# Patient Record
Sex: Male | Born: 1935 | Race: White | Hispanic: No | Marital: Married | Smoking: Former smoker
Health system: Southern US, Community
[De-identification: ages and names within clinical notes are randomized; demographics above are authoritative.]

## PROBLEM LIST (undated history)

## (undated) DIAGNOSIS — C801 Malignant (primary) neoplasm, unspecified: Secondary | ICD-10-CM

## (undated) DIAGNOSIS — K922 Gastrointestinal hemorrhage, unspecified: Secondary | ICD-10-CM

## (undated) DIAGNOSIS — K5792 Diverticulitis of intestine, part unspecified, without perforation or abscess without bleeding: Secondary | ICD-10-CM

## (undated) DIAGNOSIS — M797 Fibromyalgia: Secondary | ICD-10-CM

## (undated) DIAGNOSIS — Z9289 Personal history of other medical treatment: Secondary | ICD-10-CM

## (undated) DIAGNOSIS — I499 Cardiac arrhythmia, unspecified: Secondary | ICD-10-CM

## (undated) HISTORY — PX: MOHS SURGERY: SUR867

## (undated) HISTORY — PX: PAROTIDECTOMY: SUR1003

## (undated) HISTORY — PX: TONSILLECTOMY: SUR1361

## (undated) HISTORY — PX: COLONOSCOPY: SHX174

## (undated) HISTORY — PX: PROSTATE SURGERY: SHX751

---

## 2012-11-03 HISTORY — PX: CARDIAC CATHETERIZATION: SHX172

## 2015-03-21 ENCOUNTER — Other Ambulatory Visit: Payer: Self-pay | Admitting: Orthopedic Surgery

## 2015-04-12 ENCOUNTER — Other Ambulatory Visit (HOSPITAL_COMMUNITY): Payer: Self-pay | Admitting: *Deleted

## 2015-04-12 ENCOUNTER — Ambulatory Visit (HOSPITAL_COMMUNITY)
Admission: RE | Admit: 2015-04-12 | Discharge: 2015-04-12 | Disposition: A | Discharge status: Home / Self Care | Payer: Medicare Other | Source: Ambulatory Visit | Attending: Orthopedic Surgery | Admitting: Orthopedic Surgery

## 2015-04-12 ENCOUNTER — Encounter (HOSPITAL_COMMUNITY)
Admission: RE | Admit: 2015-04-12 | Discharge: 2015-04-12 | Disposition: A | Discharge status: Home / Self Care | Payer: Medicare Other | Source: Ambulatory Visit | Attending: Orthopedic Surgery | Admitting: Orthopedic Surgery

## 2015-04-12 ENCOUNTER — Encounter (HOSPITAL_COMMUNITY): Payer: Self-pay

## 2015-04-12 DIAGNOSIS — Z01818 Encounter for other preprocedural examination: Secondary | ICD-10-CM

## 2015-04-12 HISTORY — DX: Gastrointestinal hemorrhage, unspecified: K92.2

## 2015-04-12 HISTORY — DX: Personal history of other medical treatment: Z92.89

## 2015-04-12 HISTORY — DX: Malignant (primary) neoplasm, unspecified: C80.1

## 2015-04-12 HISTORY — DX: Fibromyalgia: M79.7

## 2015-04-12 HISTORY — DX: Diverticulitis of intestine, part unspecified, without perforation or abscess without bleeding: K57.92

## 2015-04-12 HISTORY — DX: Cardiac arrhythmia, unspecified: I49.9

## 2015-04-12 LAB — CBC WITH DIFFERENTIAL/PLATELET
BASOS PCT: 1 % (ref 0–1)
Basophils Absolute: 0 10*3/uL (ref 0.0–0.1)
EOS PCT: 3 % (ref 0–5)
Eosinophils Absolute: 0.2 10*3/uL (ref 0.0–0.7)
HEMATOCRIT: 46.5 % (ref 39.0–52.0)
Hemoglobin: 16 g/dL (ref 13.0–17.0)
LYMPHS ABS: 1.6 10*3/uL (ref 0.7–4.0)
Lymphocytes Relative: 21 % (ref 12–46)
MCH: 33.9 pg (ref 26.0–34.0)
MCHC: 34.4 g/dL (ref 30.0–36.0)
MCV: 98.5 fL (ref 78.0–100.0)
MONO ABS: 0.9 10*3/uL (ref 0.1–1.0)
Monocytes Relative: 12 % (ref 3–12)
NEUTROS ABS: 4.9 10*3/uL (ref 1.7–7.7)
Neutrophils Relative %: 63 % (ref 43–77)
Platelets: 221 10*3/uL (ref 150–400)
RBC: 4.72 MIL/uL (ref 4.22–5.81)
RDW: 12.3 % (ref 11.5–15.5)
WBC: 7.7 10*3/uL (ref 4.0–10.5)

## 2015-04-12 LAB — SURGICAL PCR SCREEN
MRSA, PCR: NEGATIVE
STAPHYLOCOCCUS AUREUS: NEGATIVE

## 2015-04-12 LAB — URINALYSIS, ROUTINE W REFLEX MICROSCOPIC
Bilirubin Urine: NEGATIVE
Glucose, UA: NEGATIVE mg/dL
KETONES UR: NEGATIVE mg/dL
Leukocytes, UA: NEGATIVE
Nitrite: NEGATIVE
PH: 5.5 (ref 5.0–8.0)
PROTEIN: NEGATIVE mg/dL
Specific Gravity, Urine: 1.012 (ref 1.005–1.030)
Urobilinogen, UA: 1 mg/dL (ref 0.0–1.0)

## 2015-04-12 LAB — BASIC METABOLIC PANEL
Anion gap: 10 (ref 5–15)
BUN: 24 mg/dL — AB (ref 6–20)
CO2: 25 mmol/L (ref 22–32)
Calcium: 9.7 mg/dL (ref 8.9–10.3)
Chloride: 103 mmol/L (ref 101–111)
Creatinine, Ser: 1.23 mg/dL (ref 0.61–1.24)
GFR calc Af Amer: >60 mL/min (ref >60)
GFR calc non Af Amer: 54 mL/min — ABNORMAL LOW (ref >60)
GLUCOSE: 94 mg/dL (ref 65–99)
Potassium: 4 mmol/L (ref 3.5–5.1)
SODIUM: 138 mmol/L (ref 135–145)

## 2015-04-12 LAB — URINE MICROSCOPIC-ADD ON

## 2015-04-12 LAB — TYPE AND SCREEN
ABO/RH(D): A POS
ANTIBODY SCREEN: NEGATIVE

## 2015-04-12 LAB — PROTIME-INR
INR: 1.56 — ABNORMAL HIGH (ref 0.00–1.49)
Prothrombin Time: 18.7 seconds — ABNORMAL HIGH (ref 11.6–15.2)

## 2015-04-12 LAB — ABO/RH: ABO/RH(D): A POS

## 2015-04-12 LAB — APTT: APTT: 38 s — AB (ref 24–37)

## 2015-04-12 NOTE — Progress Notes (Addendum)
Mr Sciuto denies chest pain, shortness of breath or lightheadedness.  Patient sees Dr West Carbo in [place] and Dr Delos Haring Daubert, cardiologist at [facility]. I have requested last office visit and EKG (Done in past month).  I have requested sleep study from [facility].

## 2015-04-12 NOTE — Pre-Procedure Instructions (Signed)
    Tyler Gould  04/12/2015        Your procedure is scheduled on Wednesday , June 22.   Report to [facility] Admitting at 11:00A.M.  Call this number if you have problems the morning of surgery:[phone]              For any other questions, please call [phone], Monday - Friday 8 AM - 4 PM.     Remember:  Do not eat food or drink liquids after midnight Tuesday, June 21.  Take these medicines the morning of surgery with A SIP OF WATER: flecainide (TAMBOCOR).                Stop rivaroxaban (XARELTO) per Dr Damita Dunnings instruction.            Stop all Aspirin, Aspirin Products, Ibuprofen (Advil), Naproxen (Aleve), Herbal Medications and VItamins.   Do not wear jewelry, make-up or nail polish.  Do not wear lotions, powders, or perfumes.  Do not shave 48 hours prior to surgery.    Do not bring valuables to the hospital.  [facility] is not responsible for any belongings or valuables.  Contacts, dentures or bridgework may not be worn into surgery.  Leave your suitcase in the car.  After surgery it may be brought to your room.  For patients admitted to the hospital, discharge time will be determined by your treatment team.               Please  Review : [place]- Preparing For Surgery  Please read over the following fact sheets that you were given. Pain Booklet, Coughing and Deep Breathing, Blood Transfusion Information and Surgical Site Infection Prevention and Incentive Spiorometery

## 2015-04-12 NOTE — Pre-Procedure Instructions (Signed)
    Tyler Gould  04/12/2015        Your procedure is scheduled on Wednesday , June 22.   Report to [facility] Admitting at 11:00A.M.  Call this number if you have problems the morning of surgery:[phone]              For any other questions, please call [phone], Monday - Friday 8 AM - 4 PM.     Remember:  Do not eat food or drink liquids after midnight Tuesday, June 21.  Take these medicines the morning of surgery with A SIP OF WATER: diltiazem                 On Thursday, June 9 Stop Vitamins and Omega 3.                Stop Xarelto 5 days prior to surgery.   Do not wear jewelry, make-up or nail polish.  Do not wear lotions, powders, or perfumes.  Do not shave 48 hours prior to surgery.    Do not bring valuables to the hospital.  [facility] is not responsible for any belongings or valuables.  Contacts, dentures or bridgework may not be worn into surgery.  Leave your suitcase in the car.  After surgery it may be brought to your room.  For patients admitted to the hospital, discharge time will be determined by your treatment team.               Please  Review : [name]- Preparing For Surgery  Please read over the following fact sheets that you were given. Pain Booklet, Coughing and Deep Breathing, Blood Transfusion Information and Surgical Site Infection Prevention and Incentive Spiorometery

## 2015-04-12 NOTE — Pre-Procedure Instructions (Signed)
    Tyler Gould  04/12/2015        Your procedure is scheduled on Wednesday , June 22.   Report to [facility] Admitting at 11:00A.M.  Call this number if you have problems the morning of surgery:[phone]              For any other questions, please call [phone], Monday - Friday 8 AM - 4 PM.     Remember:  Do not eat food or drink liquids after midnight Tuesday, June 21.  Take these medicines the morning of surgery with A SIP OF WATER: Diltaizem,    Do not wear jewelry, make-up or nail polish.  Do not wear lotions, powders, or perfumes.  Do not shave 48 hours prior to surgery.    Do not bring valuables to the hospital.  [facility] is not responsible for any belongings or valuables.  Contacts, dentures or bridgework may not be worn into surgery.  Leave your suitcase in the car.  After surgery it may be brought to your room.  For patients admitted to the hospital, discharge time will be determined by your treatment team.               Please  Review : [place]- Preparing For Surgery  Please read over the following fact sheets that you were given. Pain Booklet, Coughing and Deep Breathing, Blood Transfusion Information and Surgical Site Infection Prevention and Incentive Spiorometery

## 2015-04-12 NOTE — Pre-Procedure Instructions (Signed)
    Tyler Gould  04/12/2015        Your procedure is scheduled on Wednesday , June 22.   [facility] at 11:00A.M.  Call this number if you have problems the morning of surgery:[phone]              For any other questions, please call [phone], Monday - Friday 8 AM - 4 PM.     Remember:  Do not eat food or drink liquids after midnight Tuesday, June 21.  Take these medicines the morning of surgery with A SIP OF WATER -   Do not wear jewelry, make-up or nail polish.  Do not wear lotions, powders, or perfumes.  Do not shave 48 hours prior to surgery.    Do not bring valuables to the hospital.  [facility] is not responsible for any belongings or valuables.  Contacts, dentures or bridgework may not be worn into surgery.  Leave your suitcase in the car.  After surgery it may be brought to your room.  For patients admitted to the hospital, discharge time will be determined by your treatment team.               Please  Review : [place]- Preparing For Surgery  Please read over the following fact sheets that you were given. Pain Booklet, Coughing and Deep Breathing, Blood Transfusion Information and Surgical Site Infection Prevention and Incentive Spiorometery

## 2015-04-19 NOTE — Progress Notes (Signed)
Office notes received from PCP without medical clearance noted.  Dr Shaune Spittle office notified.  Sleep study on chart. Re-requested EKG from [facility].

## 2015-04-24 MED ORDER — DEXTROSE-NACL 5-0.45 % IV SOLN: INTRAVENOUS | Status: DC

## 2015-04-24 MED ORDER — CEFAZOLIN SODIUM-DEXTROSE 2-3 GM-% IV SOLR
2.0000 g | INTRAVENOUS | Status: DC
  Filled 2015-04-24: qty 50

## 2015-04-24 NOTE — H&P (Signed)
TOTAL KNEE ADMISSION H&P  Patient is being admitted for right total knee arthroplasty.  Subjective:  Chief Complaint:right knee pain.  HPI: Tyler Gould, 81 y.o. male, has a history of pain and functional disability in the right knee due to arthritis and has failed non-surgical conservative treatments for greater than 12 weeks to includeNSAID's and/or analgesics, corticosteriod injections, viscosupplementation injections, flexibility and strengthening excercises, use of assistive devices, weight reduction as appropriate and activity modification.  Onset of symptoms was gradual, starting 2 years ago with gradually worsening course since that time. The patient noted no past surgery on the right knee(s).  Patient currently rates pain in the right knee(s) at 10 out of 10 with activity. Patient has night pain, worsening of pain with activity and weight bearing, pain that interferes with activities of daily living, pain with passive range of motion, crepitus and joint swelling.  Patient has evidence of subchondral sclerosis and joint space narrowing by imaging studies.  There is no active infection.  There are no active problems to display for this patient.  Past Medical History  Diagnosis Date  . Dysrhythmia     Aflutter  . Fibromyalgia   . Cancer     Prostate, Skin cancer  . History of blood product transfusion 2006ish    6 units  GI - from NSAIDs  . GI (gastrointestinal bleed) 2006 [facility]    NSAIDS  . Diverticulitis     Past Surgical History  Procedure Laterality Date  . Cardiac catheterization  2014    Attempted ablation unable to   . Prostate surgery    . Colonoscopy    . Tonsillectomy    . Parotidectomy      bil  . Mohs surgery      left side of face    No prescriptions prior to admission   Allergies  Allergen Reactions  . Nitrofurantoin Itching and Rash  . Hydrocodone Other (See Comments)  . Oxycodone Other (See Comments)  . Sulfa Antibiotics Rash  . Clindamycin Other  (See Comments)  . Penicillins Other (See Comments)    History  Substance Use Topics  . Smoking status: Former Smoker -- 40 years  . Smokeless tobacco: Not on file     Comment: 2014  . Alcohol Use: 4.2 oz/week    4 Glasses of wine, 3 Shots of liquor per week    No family history on file.   Review of Systems  Constitutional: Negative.   HENT: Negative.   Eyes:       Glasses  Respiratory: Negative.   Cardiovascular:       HTN and irregular heart beat  Gastrointestinal: Negative.   Genitourinary:       ED and hx of prostate cancer  Musculoskeletal: Positive for joint pain.  Neurological: Negative.   Endo/Heme/Allergies: Negative.   Psychiatric/Behavioral: Negative.     Objective:  Physical Exam  Constitutional: He is oriented to person, place, and time. He appears well-developed and well-nourished.  HENT:  Head: Normocephalic and atraumatic.  Eyes: Pupils are equal, round, and reactive to light.  Neck: Normal range of motion. Neck supple.  Cardiovascular: Intact distal pulses.   Respiratory: Effort normal.  Musculoskeletal: He exhibits tenderness.  Tender along the lateral joint line of the right knee.  One plus effusion. Range of motion is 0-120 there is crepitus as you taken through range of motion.    Neurological: He is alert and oriented to person, place, and time.  Skin: Skin is warm and dry.  Psychiatric: He has a normal mood and affect. His behavior is normal. Judgment and thought content normal.    Vital signs in last 24 hours:    Labs:   There is no height or weight on file to calculate BMI.   Imaging Review Plain radiographs demonstrate severe degenerative joint disease of the bilaterally knee(s).   Assessment/Plan:  End stage arthritis, right knee   The patient history, physical examination, clinical judgment of the provider and imaging studies are consistent with end stage degenerative joint disease of the right knee(s) and total knee  arthroplasty is deemed medically necessary. The treatment options including medical management, injection therapy arthroscopy and arthroplasty were discussed at length. The risks and benefits of total knee arthroplasty were presented and reviewed. The risks due to aseptic loosening, infection, stiffness, patella tracking problems, thromboembolic complications and other imponderables were discussed. The patient acknowledged the explanation, agreed to proceed with the plan and consent was signed. Patient is being admitted for inpatient treatment for surgery, pain control, PT, OT, prophylactic antibiotics, VTE prophylaxis, progressive ambulation and ADL's and discharge planning. The patient is planning to be discharged home with home health services

## 2015-04-24 NOTE — Progress Notes (Signed)
Pt notified of time change-new arrival of 1045-verbalized understanding

## 2015-04-25 ENCOUNTER — Inpatient Hospital Stay (HOSPITAL_COMMUNITY): Payer: Medicare Other | Admitting: Anesthesiology

## 2015-04-25 ENCOUNTER — Encounter (HOSPITAL_COMMUNITY): Payer: Self-pay | Admitting: Surgery

## 2015-04-25 ENCOUNTER — Inpatient Hospital Stay (HOSPITAL_COMMUNITY)
Admission: RE | Admit: 2015-04-25 | Discharge: 2015-04-27 | DRG: 470 | Disposition: A | Discharge status: Home Health | Payer: Medicare Other | Source: Ambulatory Visit | Attending: Orthopedic Surgery | Admitting: Orthopedic Surgery

## 2015-04-25 ENCOUNTER — Encounter (HOSPITAL_COMMUNITY)
Admission: RE | Disposition: A | Discharge status: Home Health | Payer: Self-pay | Source: Ambulatory Visit | Attending: Orthopedic Surgery

## 2015-04-25 DIAGNOSIS — M1710 Unilateral primary osteoarthritis, unspecified knee: Secondary | ICD-10-CM | POA: Diagnosis present

## 2015-04-25 DIAGNOSIS — Z885 Allergy status to narcotic agent status: Secondary | ICD-10-CM

## 2015-04-25 DIAGNOSIS — Z79899 Other long term (current) drug therapy: Secondary | ICD-10-CM | POA: Diagnosis not present

## 2015-04-25 DIAGNOSIS — Z888 Allergy status to other drugs, medicaments and biological substances status: Secondary | ICD-10-CM

## 2015-04-25 DIAGNOSIS — Z882 Allergy status to sulfonamides status: Secondary | ICD-10-CM

## 2015-04-25 DIAGNOSIS — Z880 Allergy status to penicillin: Secondary | ICD-10-CM

## 2015-04-25 DIAGNOSIS — D62 Acute posthemorrhagic anemia: Secondary | ICD-10-CM | POA: Diagnosis not present

## 2015-04-25 DIAGNOSIS — Z881 Allergy status to other antibiotic agents status: Secondary | ICD-10-CM | POA: Diagnosis not present

## 2015-04-25 DIAGNOSIS — M797 Fibromyalgia: Secondary | ICD-10-CM | POA: Diagnosis present

## 2015-04-25 DIAGNOSIS — Z7902 Long term (current) use of antithrombotics/antiplatelets: Secondary | ICD-10-CM | POA: Diagnosis not present

## 2015-04-25 DIAGNOSIS — Z87891 Personal history of nicotine dependence: Secondary | ICD-10-CM | POA: Diagnosis not present

## 2015-04-25 DIAGNOSIS — M1711 Unilateral primary osteoarthritis, right knee: Principal | ICD-10-CM | POA: Diagnosis present

## 2015-04-25 DIAGNOSIS — M25561 Pain in right knee: Secondary | ICD-10-CM | POA: Diagnosis present

## 2015-04-25 DIAGNOSIS — I4892 Unspecified atrial flutter: Secondary | ICD-10-CM | POA: Diagnosis present

## 2015-04-25 HISTORY — PX: TOTAL KNEE ARTHROPLASTY: SHX125

## 2015-04-25 SURGERY — ARTHROPLASTY, KNEE, TOTAL
Anesthesia: Monitor Anesthesia Care | Site: Knee | Laterality: Right

## 2015-04-25 MED ORDER — VITAMIN D 1000 UNITS PO TABS
2000.0000 [IU] | ORAL_TABLET | Freq: Every day | ORAL | Status: DC
  Administered 2015-04-26 – 2015-04-27 (×2): 2000 [IU] via ORAL
  Filled 2015-04-25 (×2): qty 2

## 2015-04-25 MED ORDER — MIDAZOLAM HCL 5 MG/5ML IJ SOLN
INTRAMUSCULAR | Status: DC | PRN
  Administered 2015-04-25: 1 mg via INTRAVENOUS

## 2015-04-25 MED ORDER — FENTANYL CITRATE (PF) 100 MCG/2ML IJ SOLN
INTRAMUSCULAR | Status: AC
  Filled 2015-04-25: qty 2

## 2015-04-25 MED ORDER — PHENOL 1.4 % MT LIQD: 1.0000 | OROMUCOSAL | Status: DC | PRN

## 2015-04-25 MED ORDER — TRANEXAMIC ACID 1000 MG/10ML IV SOLN
2000.0000 mg | Freq: Once | INTRAVENOUS | Status: AC
  Administered 2015-04-25: 2000 mg via TOPICAL
  Filled 2015-04-25: qty 20

## 2015-04-25 MED ORDER — BISACODYL 5 MG PO TBEC: 5.0000 mg | DELAYED_RELEASE_TABLET | Freq: Every day | ORAL | Status: DC | PRN

## 2015-04-25 MED ORDER — LACTATED RINGERS IV SOLN
INTRAVENOUS | Status: DC | PRN
  Administered 2015-04-25 (×2): via INTRAVENOUS

## 2015-04-25 MED ORDER — VITAMIN C 500 MG PO TABS
1000.0000 mg | ORAL_TABLET | Freq: Every day | ORAL | Status: DC
  Administered 2015-04-26 – 2015-04-27 (×2): 1000 mg via ORAL
  Filled 2015-04-25 (×3): qty 2

## 2015-04-25 MED ORDER — ONDANSETRON HCL 4 MG/2ML IJ SOLN
INTRAMUSCULAR | Status: DC | PRN
  Administered 2015-04-25: 4 mg via INTRAVENOUS

## 2015-04-25 MED ORDER — ADULT MULTIVITAMIN W/MINERALS CH
1.0000 | ORAL_TABLET | Freq: Every day | ORAL | Status: DC
  Administered 2015-04-26 – 2015-04-27 (×2): 1 via ORAL
  Filled 2015-04-25 (×2): qty 1

## 2015-04-25 MED ORDER — SODIUM CHLORIDE 0.9 % IR SOLN
Status: DC | PRN
  Administered 2015-04-25: 1000 mL
  Administered 2015-04-25: 3000 mL

## 2015-04-25 MED ORDER — MULTIVITAMIN ADULTS 50+ PO TABS: 1.0000 | ORAL_TABLET | Freq: Every day | ORAL | Status: DC

## 2015-04-25 MED ORDER — METOCLOPRAMIDE HCL 5 MG/ML IJ SOLN: 5.0000 mg | Freq: Three times a day (TID) | INTRAMUSCULAR | Status: DC | PRN

## 2015-04-25 MED ORDER — PROPOFOL INFUSION 10 MG/ML OPTIME
INTRAVENOUS | Status: DC | PRN
  Administered 2015-04-25: 50 ug/kg/min via INTRAVENOUS

## 2015-04-25 MED ORDER — LISINOPRIL 10 MG PO TABS
10.0000 mg | ORAL_TABLET | Freq: Every day | ORAL | Status: DC
  Administered 2015-04-25 – 2015-04-27 (×3): 10 mg via ORAL
  Filled 2015-04-25 (×3): qty 1

## 2015-04-25 MED ORDER — SODIUM CHLORIDE 0.9 % IJ SOLN
INTRAMUSCULAR | Status: DC | PRN
  Administered 2015-04-25: 40 mL

## 2015-04-25 MED ORDER — CHLORHEXIDINE GLUCONATE 4 % EX LIQD: 60.0000 mL | Freq: Once | CUTANEOUS | Status: DC

## 2015-04-25 MED ORDER — ACETAMINOPHEN 650 MG RE SUPP: 650.0000 mg | Freq: Four times a day (QID) | RECTAL | Status: DC | PRN

## 2015-04-25 MED ORDER — RIVAROXABAN 20 MG PO TABS: 20.0000 mg | ORAL_TABLET | Freq: Every day | ORAL | Status: AC

## 2015-04-25 MED ORDER — ONDANSETRON HCL 4 MG/2ML IJ SOLN: 4.0000 mg | Freq: Four times a day (QID) | INTRAMUSCULAR | Status: DC | PRN

## 2015-04-25 MED ORDER — VANCOMYCIN HCL IN DEXTROSE 1-5 GM/200ML-% IV SOLN
INTRAVENOUS | Status: AC
  Filled 2015-04-25: qty 200

## 2015-04-25 MED ORDER — FENTANYL CITRATE (PF) 250 MCG/5ML IJ SOLN
INTRAMUSCULAR | Status: AC
  Filled 2015-04-25: qty 5

## 2015-04-25 MED ORDER — METOCLOPRAMIDE HCL 5 MG PO TABS
5.0000 mg | ORAL_TABLET | Freq: Three times a day (TID) | ORAL | Status: DC | PRN
  Filled 2015-04-25: qty 2

## 2015-04-25 MED ORDER — FENTANYL CITRATE (PF) 100 MCG/2ML IJ SOLN
25.0000 ug | INTRAMUSCULAR | Status: DC | PRN
  Administered 2015-04-25 (×2): 50 ug via INTRAVENOUS

## 2015-04-25 MED ORDER — RIVAROXABAN 10 MG PO TABS: 10.0000 mg | ORAL_TABLET | Freq: Every day | ORAL | Status: AC

## 2015-04-25 MED ORDER — BUPIVACAINE LIPOSOME 1.3 % IJ SUSP
20.0000 mL | Freq: Once | INTRAMUSCULAR | Status: AC
  Administered 2015-04-25: 20 mL
  Filled 2015-04-25: qty 20

## 2015-04-25 MED ORDER — HYDROCHLOROTHIAZIDE 25 MG PO TABS
25.0000 mg | ORAL_TABLET | Freq: Every day | ORAL | Status: DC
  Administered 2015-04-25 – 2015-04-27 (×3): 25 mg via ORAL
  Filled 2015-04-25 (×3): qty 1

## 2015-04-25 MED ORDER — LIDOCAINE HCL (CARDIAC) 20 MG/ML IV SOLN
INTRAVENOUS | Status: AC
  Filled 2015-04-25: qty 5

## 2015-04-25 MED ORDER — ACETAMINOPHEN 325 MG PO TABS: 650.0000 mg | ORAL_TABLET | Freq: Four times a day (QID) | ORAL | Status: DC | PRN

## 2015-04-25 MED ORDER — ALUM & MAG HYDROXIDE-SIMETH 200-200-20 MG/5ML PO SUSP: 30.0000 mL | ORAL | Status: DC | PRN

## 2015-04-25 MED ORDER — SUCCINYLCHOLINE CHLORIDE 20 MG/ML IJ SOLN
INTRAMUSCULAR | Status: AC
  Filled 2015-04-25: qty 1

## 2015-04-25 MED ORDER — FENTANYL CITRATE (PF) 100 MCG/2ML IJ SOLN
INTRAMUSCULAR | Status: AC
  Administered 2015-04-25: 50 ug via INTRAVENOUS
  Filled 2015-04-25: qty 2

## 2015-04-25 MED ORDER — PROPOFOL 10 MG/ML IV BOLUS
INTRAVENOUS | Status: AC
  Filled 2015-04-25: qty 20

## 2015-04-25 MED ORDER — ONDANSETRON HCL 4 MG/2ML IJ SOLN
INTRAMUSCULAR | Status: AC
  Administered 2015-04-25: 17:00:00
  Filled 2015-04-25: qty 2

## 2015-04-25 MED ORDER — DOCUSATE SODIUM 100 MG PO CAPS
100.0000 mg | ORAL_CAPSULE | Freq: Two times a day (BID) | ORAL | Status: DC
  Administered 2015-04-25 – 2015-04-27 (×4): 100 mg via ORAL
  Filled 2015-04-25 (×4): qty 1

## 2015-04-25 MED ORDER — METHOCARBAMOL 500 MG PO TABS: 500.0000 mg | ORAL_TABLET | Freq: Four times a day (QID) | ORAL | Status: DC | PRN

## 2015-04-25 MED ORDER — DILTIAZEM HCL ER BEADS 120 MG PO CP24
120.0000 mg | ORAL_CAPSULE | Freq: Every day | ORAL | Status: DC
  Administered 2015-04-26 – 2015-04-27 (×2): 120 mg via ORAL
  Filled 2015-04-25 (×2): qty 1

## 2015-04-25 MED ORDER — METHOCARBAMOL 1000 MG/10ML IJ SOLN
500.0000 mg | Freq: Four times a day (QID) | INTRAVENOUS | Status: DC | PRN
  Filled 2015-04-25: qty 5

## 2015-04-25 MED ORDER — CEFUROXIME SODIUM 1.5 G IJ SOLR
INTRAMUSCULAR | Status: AC
  Filled 2015-04-25: qty 3

## 2015-04-25 MED ORDER — SENNOSIDES-DOCUSATE SODIUM 8.6-50 MG PO TABS: 1.0000 | ORAL_TABLET | Freq: Every evening | ORAL | Status: DC | PRN

## 2015-04-25 MED ORDER — ONDANSETRON HCL 4 MG PO TABS: 4.0000 mg | ORAL_TABLET | Freq: Four times a day (QID) | ORAL | Status: DC | PRN

## 2015-04-25 MED ORDER — MIDAZOLAM HCL 2 MG/2ML IJ SOLN
INTRAMUSCULAR | Status: AC
  Filled 2015-04-25: qty 2

## 2015-04-25 MED ORDER — MENTHOL 3 MG MT LOZG: 1.0000 | LOZENGE | OROMUCOSAL | Status: DC | PRN

## 2015-04-25 MED ORDER — DIPHENHYDRAMINE HCL 12.5 MG/5ML PO ELIX: 12.5000 mg | ORAL_SOLUTION | ORAL | Status: DC | PRN

## 2015-04-25 MED ORDER — HYDROMORPHONE HCL 2 MG PO TABS
2.0000 mg | ORAL_TABLET | ORAL | Status: DC | PRN
  Administered 2015-04-25 – 2015-04-27 (×5): 2 mg via ORAL
  Filled 2015-04-25 (×5): qty 1

## 2015-04-25 MED ORDER — ONDANSETRON HCL 4 MG/2ML IJ SOLN
INTRAMUSCULAR | Status: AC
  Filled 2015-04-25: qty 2

## 2015-04-25 MED ORDER — KCL IN DEXTROSE-NACL 20-5-0.45 MEQ/L-%-% IV SOLN
INTRAVENOUS | Status: DC
  Administered 2015-04-25 – 2015-04-26 (×2): via INTRAVENOUS
  Filled 2015-04-25 (×2): qty 1000

## 2015-04-25 MED ORDER — HYDROMORPHONE HCL 2 MG PO TABS: 2.0000 mg | ORAL_TABLET | Freq: Four times a day (QID) | ORAL | Status: AC | PRN

## 2015-04-25 MED ORDER — SIMVASTATIN 10 MG PO TABS
10.0000 mg | ORAL_TABLET | Freq: Every day | ORAL | Status: DC
  Administered 2015-04-25 – 2015-04-27 (×3): 10 mg via ORAL
  Filled 2015-04-25 (×3): qty 1

## 2015-04-25 MED ORDER — TIZANIDINE HCL 2 MG PO TABS: 2.0000 mg | ORAL_TABLET | Freq: Four times a day (QID) | ORAL | Status: AC | PRN

## 2015-04-25 MED ORDER — HYDROMORPHONE HCL 1 MG/ML IJ SOLN
0.5000 mg | INTRAMUSCULAR | Status: DC | PRN
  Administered 2015-04-25: 1 mg via INTRAVENOUS
  Filled 2015-04-25: qty 1

## 2015-04-25 MED ORDER — VANCOMYCIN HCL 1000 MG IV SOLR
1000.0000 mg | INTRAVENOUS | Status: DC | PRN
  Administered 2015-04-25: 1000 mg via INTRAVENOUS

## 2015-04-25 MED ORDER — RIVAROXABAN 10 MG PO TABS
10.0000 mg | ORAL_TABLET | Freq: Every day | ORAL | Status: DC
  Administered 2015-04-26 – 2015-04-27 (×2): 10 mg via ORAL
  Filled 2015-04-25 (×2): qty 1

## 2015-04-25 MED ORDER — ONDANSETRON HCL 4 MG/2ML IJ SOLN
4.0000 mg | Freq: Once | INTRAMUSCULAR | Status: AC | PRN
  Administered 2015-04-25: 4 mg via INTRAVENOUS

## 2015-04-25 MED ORDER — FENTANYL CITRATE (PF) 100 MCG/2ML IJ SOLN
INTRAMUSCULAR | Status: DC | PRN
  Administered 2015-04-25 (×4): 50 ug via INTRAVENOUS

## 2015-04-25 MED ORDER — LACTATED RINGERS IV SOLN
INTRAVENOUS | Status: DC
  Administered 2015-04-25: 11:00:00 via INTRAVENOUS

## 2015-04-25 MED ORDER — SODIUM CHLORIDE 0.9 % IJ SOLN
INTRAMUSCULAR | Status: AC
  Filled 2015-04-25: qty 12

## 2015-04-25 MED ORDER — FLEET ENEMA 7-19 GM/118ML RE ENEM: 1.0000 | ENEMA | Freq: Once | RECTAL | Status: AC | PRN

## 2015-04-25 SURGICAL SUPPLY — 61 items
BANDAGE ESMARK 6X9 LF (GAUZE/BANDAGES/DRESSINGS) ×1 IMPLANT
BLADE SAG 18X100X1.27 (BLADE) ×3 IMPLANT
BLADE SAW SGTL 13X75X1.27 (BLADE) ×3 IMPLANT
BLADE SURG ROTATE 9660 (MISCELLANEOUS) IMPLANT
BNDG ELASTIC 6X10 VLCR STRL LF (GAUZE/BANDAGES/DRESSINGS) ×3 IMPLANT
BNDG ESMARK 6X9 LF (GAUZE/BANDAGES/DRESSINGS) ×3
BOWL SMART MIX CTS (DISPOSABLE) ×3 IMPLANT
CAPT KNEE TOTAL 3 ATTUNE ×3 IMPLANT
CEMENT HV SMART SET (Cement) ×3 IMPLANT
COVER SURGICAL LIGHT HANDLE (MISCELLANEOUS) ×3 IMPLANT
CUFF TOURNIQUET SINGLE 34IN LL (TOURNIQUET CUFF) ×3 IMPLANT
CUFF TOURNIQUET SINGLE 44IN (TOURNIQUET CUFF) IMPLANT
DRAPE EXTREMITY T 121X128X90 (DRAPE) ×3 IMPLANT
DRAPE IMP U-DRAPE 54X76 (DRAPES) ×3 IMPLANT
DRAPE U-SHAPE 47X51 STRL (DRAPES) ×3 IMPLANT
DURAPREP 26ML APPLICATOR (WOUND CARE) ×3 IMPLANT
ELECT REM PT RETURN 9FT ADLT (ELECTROSURGICAL) ×3
ELECTRODE REM PT RTRN 9FT ADLT (ELECTROSURGICAL) ×1 IMPLANT
EVACUATOR 1/8 PVC DRAIN (DRAIN) IMPLANT
GAUZE SPONGE 4X4 12PLY STRL (GAUZE/BANDAGES/DRESSINGS) ×3 IMPLANT
GAUZE XEROFORM 1X8 LF (GAUZE/BANDAGES/DRESSINGS) ×3 IMPLANT
GLOVE BIO SURGEON STRL SZ7.5 (GLOVE) ×3 IMPLANT
GLOVE BIO SURGEON STRL SZ8.5 (GLOVE) ×3 IMPLANT
GLOVE BIOGEL PI IND STRL 8 (GLOVE) ×1 IMPLANT
GLOVE BIOGEL PI IND STRL 9 (GLOVE) IMPLANT
GLOVE BIOGEL PI INDICATOR 8 (GLOVE) ×2
GLOVE BIOGEL PI INDICATOR 9 (GLOVE)
GOWN STRL REUS W/ TWL LRG LVL3 (GOWN DISPOSABLE) ×1 IMPLANT
GOWN STRL REUS W/ TWL XL LVL3 (GOWN DISPOSABLE) ×3 IMPLANT
GOWN STRL REUS W/TWL LRG LVL3 (GOWN DISPOSABLE) ×2
GOWN STRL REUS W/TWL XL LVL3 (GOWN DISPOSABLE) ×6
HANDPIECE INTERPULSE COAX TIP (DISPOSABLE) ×2
HOOD PEEL AWAY FACE SHEILD DIS (HOOD) ×3 IMPLANT
KIT BASIN OR (CUSTOM PROCEDURE TRAY) ×3 IMPLANT
KIT ROOM TURNOVER OR (KITS) ×3 IMPLANT
MANIFOLD NEPTUNE II (INSTRUMENTS) ×3 IMPLANT
NDL SAFETY ECLIPSE 18X1.5 (NEEDLE) ×1 IMPLANT
NEEDLE 22X1 1/2 (OR ONLY) (NEEDLE) ×3 IMPLANT
NEEDLE HYPO 18GX1.5 SHARP (NEEDLE) ×2
NEEDLE SPNL 18GX3.5 QUINCKE PK (NEEDLE) ×3 IMPLANT
NS IRRIG 1000ML POUR BTL (IV SOLUTION) ×3 IMPLANT
PACK TOTAL JOINT (CUSTOM PROCEDURE TRAY) ×3 IMPLANT
PACK UNIVERSAL I (CUSTOM PROCEDURE TRAY) IMPLANT
PAD ARMBOARD 7.5X6 YLW CONV (MISCELLANEOUS) IMPLANT
PADDING CAST COTTON 6X4 STRL (CAST SUPPLIES) ×3 IMPLANT
SET HNDPC FAN SPRY TIP SCT (DISPOSABLE) ×1 IMPLANT
SUCTION FRAZIER TIP 10 FR DISP (SUCTIONS) IMPLANT
SUT VIC AB 0 CT1 27 (SUTURE) ×2
SUT VIC AB 0 CT1 27XBRD ANBCTR (SUTURE) ×1 IMPLANT
SUT VIC AB 1 CTX 36 (SUTURE) ×2
SUT VIC AB 1 CTX36XBRD ANBCTR (SUTURE) ×1 IMPLANT
SUT VIC AB 2-0 CT1 27 (SUTURE) ×2
SUT VIC AB 2-0 CT1 TAPERPNT 27 (SUTURE) ×1 IMPLANT
SUT VIC AB 3-0 CT1 27 (SUTURE) ×2
SUT VIC AB 3-0 CT1 TAPERPNT 27 (SUTURE) ×1 IMPLANT
SUT VIC AB 3-0 FS2 27 (SUTURE) IMPLANT
SYR 30ML LL (SYRINGE) ×3 IMPLANT
SYR 50ML LL SCALE MARK (SYRINGE) ×3 IMPLANT
TOWEL OR 17X24 6PK STRL BLUE (TOWEL DISPOSABLE) ×3 IMPLANT
TOWEL OR 17X26 10 PK STRL BLUE (TOWEL DISPOSABLE) ×3 IMPLANT
WATER STERILE IRR 1000ML POUR (IV SOLUTION) ×9 IMPLANT

## 2015-04-25 NOTE — Progress Notes (Signed)
Utilization review completed.  

## 2015-04-25 NOTE — Op Note (Signed)
PATIENT ID:      Tyler Gould  MRN:     [id number] DOB/AGE:    01-11-1932 / 81 y.o.       OPERATIVE REPORT    DATE OF PROCEDURE:  04/25/2015       PREOPERATIVE DIAGNOSIS:   OSTEOARTHRITIS RIGHT KNEE      Estimated body mass index is 31.33 kg/(m^2) as calculated from the following:   Height as of this encounter: 6' (1.829 m).   Weight as of this encounter: 104.801 kg (231 lb 0.7 oz).                                                        POSTOPERATIVE DIAGNOSIS:   OSTEOARTHRITIS RIGHT KNEE                                                                      PROCEDURE:  Procedure(s): TOTAL KNEE ARTHROPLASTY Using DepuyAttune RP implants #8R Femur, #10Tibia, 5 mm Attune RP bearing, 41 Patella     SURGEON: Christan Defranco J    ASSISTANT:   [employer]   (Present and scrubbed throughout the case, critical for assistance with exposure, retraction, instrumentation, and closure.)         ANESTHESIA: Spinal, Exparel  EBL: 300  FLUID REPLACEMENT: 1500 crystalloid  TOURNIQUET TIME: 86min  Drains: None  Tranexamic Acid: 2gm topical   COMPLICATIONS:  None         INDICATIONS FOR PROCEDURE: The patient has  OSTEOARTHRITIS RIGHT KNEE, varus deformities, XR shows bone on bone arthritis. Patient has failed all conservative measures including anti-inflammatory medicines, narcotics, attempts at  exercise and weight loss, cortisone injections and viscosupplementation.  Risks and benefits of surgery have been discussed, questions answered.   DESCRIPTION OF PROCEDURE: The patient identified by armband, received  IV antibiotics, in the holding area at [facility]. Patient taken to the operating room, appropriate anesthetic  monitors were attached, and general endotracheal anesthesia induced with  the patient in supine position. Tourniquet  applied high to the operative thigh. Lateral post and foot positioner  applied to the table, the lower extremity was then prepped and draped  in usual  sterile fashion from the ankle to the tourniquet. Time-out procedure was performed. We began the operation, with the knee flexed 100 degrees, by making the anterior midline incision starting at handbreadth above the patella going over the patella 1 cm medial to and 4 cm distal to the tibial tubercle. Small bleeders in the skin and the  subcutaneous tissue identified and cauterized. Transverse retinaculum was incised and reflected medially and a medial parapatellar arthrotomy was accomplished. the patella was everted and theprepatellar fat pad resected. The superficial medial collateral  ligament was then elevated from anterior to posterior along the proximal  flare of the tibia and anterior half of the menisci resected. The knee was hyperflexed exposing bone on bone arthritis. Peripheral and notch osteophytes as well as the cruciate ligaments were then resected. We continued to  work our way around posteriorly along the proximal tibia, and externally  rotated the tibia subluxing it out from underneath the femur. A McHale  retractor was placed through the notch and a lateral Hohmann retractor  placed, and we then drilled through the proximal tibia in line with the  axis of the tibia followed by an intramedullary guide rod and 2-degree  posterior slope cutting guide. The tibial cutting guide, 3 degree posterior sloped, was pinned into place allowing resection of 10 mm of bone medially and about 0 mm of bone laterally. Satisfied with the tibial resection, we then  entered the distal femur 2 mm anterior to the PCL origin with the  intramedullary guide rod and applied the distal femoral cutting guide  set at 72mm, with 5 degrees of valgus. This was pinned along the  epicondylar axis. At this point, the distal femoral cut was accomplished without difficulty. We then sized for a #8R femoral component and pinned the guide in 3 degrees of external rotation.The chamfer cutting guide was pinned into place. The  anterior, posterior, and chamfer cuts were accomplished without difficulty followed by  the Attune RP box cutting guide and the box cut. We also removed posterior osteophytes from the posterior femoral condyles. At this  time, the knee was brought into full extension. We checked our  extension and flexion gaps and found them symmetric for a 5 mm bearing. Distracting in extension with a lamina spreader, the posterior horns of the menisci were removed, and Exparel, diluted to 60 cc, was injected into the capsule of the knee. The  posterior patella cut was accomplished with the 9.5 mm Attune cutting guide, sized at 41* dome, and the fixation pegs drilled.The knee  was then once again hyperflexed exposing the proximal tibia. We sized for a #10 tibial base plate, applied the smokestack and the conical reamer followed by the the Delta fin keel punch. We then hammered into place the Attune RP trial femoral component, inserted a  5 mm trial bearing, trial patellar button, and took the knee through range of motion from 0-130 degrees. No thumb pressure was required for patellar  Tracking. At this point, the limb was wrapped with an Esmarch bandage and the tourniquet inflated to 350 mmHg. All trial components were removed, mating surfaces irrigated with pulse lavage, and dried with suction and sponges. A double batch of DePuy HV cement with 1500 mg of Zinacef was mixed and applied to all bony metallic mating surfaces except for the posterior condyles of the femur itself. In order, we  hammered into place the tibial tray and removed excess cement, the femoral component and removed excess cement,  The 5  Attune RP bearing  was inserted, and the knee brought to full extension with compression.  The patellar button was clamped into place, and excess cement  removed. While the cement cured the wound was irrigated out with normal saline solution pulse lavage. Ligament stability and patellar tracking were checked and found  to be excellent. The parapatellar arthrotomy was closed with  running #1 Vicryl suture. The subcutaneous tissue with 0 and 2-0 undyed  Vicryl suture, and the skin with running 3-0 SQ vicryl. A dressing of Xeroform,  4 x 4, dressing sponges, Webril, and Ace wrap applied. The patient  awakened, extubated, and taken to recovery room without difficulty.   Kerin Salen 04/25/2015, 2:49 PM

## 2015-04-25 NOTE — Anesthesia Postprocedure Evaluation (Signed)
  Anesthesia Post-op Note  Patient: Tyler Gould  Procedure(s) Performed: Procedure(s): TOTAL KNEE ARTHROPLASTY (Right)  Patient Location: PACU  Anesthesia Type:Spinal  Level of Consciousness: awake, alert , oriented and patient cooperative  Airway and Oxygen Therapy: Patient Spontanous Breathing  Post-op Pain: none  Post-op Assessment: Post-op Vital signs reviewed, Patient's Cardiovascular Status Stable, Respiratory Function Stable, Patent Airway, No signs of Nausea or vomiting and Pain level controlled LLE Motor Response: Purposeful movement, Responds to commands   RLE Motor Response: Purposeful movement, Responds to commands   L Sensory Level: L4-Anterior knee, lower leg R Sensory Level: L4-Anterior knee, lower leg  Post-op Vital Signs: stable  Last Vitals:  Filed Vitals:   04/25/15 1645  BP:   Pulse: 69  Temp:   Resp: 13    Complications: No apparent anesthesia complications

## 2015-04-25 NOTE — Progress Notes (Signed)
Orthopedic Tech Progress Note Patient Details:  Tyler Gould Dec 29, 1931 [id number]  CPM Right Knee CPM Right Knee: On Right Knee Flexion (Degrees): 40 Right Knee Extension (Degrees): 0 Additional Comments: trapeze bar patient helper Viewed order from doctor's order list  Hildred Priest 04/25/2015, 4:02 PM

## 2015-04-25 NOTE — Progress Notes (Signed)
C/O nausea - had given / encouraged sipping on Coke over ice, grape popcycle, saltine crackers, and applesauce. When returned with Reglan PO, stated nausea resolved and refused antiemetic. Stated would prefer to continue with sipping Coke and nibbling on Saltines. Stated would like a "real breakfast." Entered reg diet order, he is calling Service Response to order breakfast for the AM.

## 2015-04-25 NOTE — Transfer of Care (Signed)
Immediate Anesthesia Transfer of Care Note  Patient: Tyler Gould  Procedure(s) Performed: Procedure(s): TOTAL KNEE ARTHROPLASTY (Right)  Patient Location: PACU  Anesthesia Type:MAC and Spinal  Level of Consciousness: awake, alert , oriented and patient cooperative  Airway & Oxygen Therapy: Patient Spontanous Breathing  Post-op Assessment: Report given to RN, Post -op Vital signs reviewed and stable and Patient moving all extremities  Post vital signs: Reviewed and stable  Last Vitals:  Filed Vitals:   04/25/15 1036  BP: 172/79  Pulse: 74  Temp: 36.2 C  Resp: 18    Complications: No apparent anesthesia complications

## 2015-04-25 NOTE — Discharge Instructions (Signed)

## 2015-04-25 NOTE — Interval H&P Note (Signed)
History and Physical Interval Note:  04/25/2015 12:44 PM  Tyler Gould  has presented today for surgery, with the diagnosis of OSTEOARTHRITIS RIGHT KNEE  The various methods of treatment have been discussed with the patient and family. After consideration of risks, benefits and other options for treatment, the patient has consented to  Procedure(s): TOTAL KNEE ARTHROPLASTY (Right) as a surgical intervention .  The patient's history has been reviewed, patient examined, no change in status, stable for surgery.  I have reviewed the patient's chart and labs.  Questions were answered to the patient's satisfaction.     Kerin Salen

## 2015-04-25 NOTE — Anesthesia Preprocedure Evaluation (Signed)
Anesthesia Evaluation  Patient identified by MRN, date of birth, ID band Patient awake    Reviewed: Allergy & Precautions, NPO status , Patient's Chart, lab work & pertinent test results  Airway Mallampati: II  TM Distance: >3 FB Neck ROM: Full    Dental  (+) Teeth Intact, Dental Advisory Given   Pulmonary former smoker,  breath sounds clear to auscultation        Cardiovascular Rhythm:Regular Rate:Normal     Neuro/Psych    GI/Hepatic   Endo/Other    Renal/GU      Musculoskeletal   Abdominal   Peds  Hematology   Anesthesia Other Findings   Reproductive/Obstetrics                             Anesthesia Physical Anesthesia Plan  ASA: III  Anesthesia Plan: MAC and Spinal   Post-op Pain Management:    Induction: Intravenous  Airway Management Planned: Natural Airway and Nasal Cannula  Additional Equipment:   Intra-op Plan:   Post-operative Plan:   Informed Consent: I have reviewed the patients History and Physical, chart, labs and discussed the procedure including the risks, benefits and alternatives for the proposed anesthesia with the patient or authorized representative who has indicated his/her understanding and acceptance.   Dental advisory given  Plan Discussed with: CRNA and Anesthesiologist  Anesthesia Plan Comments: (Atrial Fibrillation on Xarelto off since 5 days H/O Prostate Ca )        Anesthesia Quick Evaluation

## 2015-04-25 NOTE — Anesthesia Procedure Notes (Signed)
Spinal Patient location during procedure: OR Start time: 04/25/2015 1:15 PM End time: 04/25/2015 1:20 PM Staffing Performed by: anesthesiologist  Spinal Block Patient position: right lateral decubitus Prep: ChloraPrep Patient monitoring: heart rate, cardiac monitor, continuous pulse ox and blood pressure Approach: right paramedian Location: L3-4 Injection technique: single-shot Needle Needle type: Tuohy  Needle gauge: 22 G Needle length: 9 cm Assessment Sensory level: T10 Additional Notes 10 mg 0.5% bupivacaine with 1:200 epi injected easily

## 2015-04-26 ENCOUNTER — Encounter (HOSPITAL_COMMUNITY): Payer: Self-pay | Admitting: Orthopedic Surgery

## 2015-04-26 LAB — BASIC METABOLIC PANEL
Anion gap: 4 — ABNORMAL LOW (ref 5–15)
BUN: 15 mg/dL (ref 6–20)
CALCIUM: 8.8 mg/dL — AB (ref 8.9–10.3)
CO2: 30 mmol/L (ref 22–32)
Chloride: 104 mmol/L (ref 101–111)
Creatinine, Ser: 1.12 mg/dL (ref 0.61–1.24)
GFR calc Af Amer: >60 mL/min (ref >60)
GFR calc non Af Amer: >60 mL/min (ref >60)
GLUCOSE: 182 mg/dL — AB (ref 65–99)
POTASSIUM: 4.6 mmol/L (ref 3.5–5.1)
SODIUM: 138 mmol/L (ref 135–145)

## 2015-04-26 LAB — CBC
HCT: 37.5 % — ABNORMAL LOW (ref 39.0–52.0)
Hemoglobin: 12.5 g/dL — ABNORMAL LOW (ref 13.0–17.0)
MCH: 33 pg (ref 26.0–34.0)
MCHC: 33.3 g/dL (ref 30.0–36.0)
MCV: 98.9 fL (ref 78.0–100.0)
Platelets: 185 10*3/uL (ref 150–400)
RBC: 3.79 MIL/uL — ABNORMAL LOW (ref 4.22–5.81)
RDW: 12.7 % (ref 11.5–15.5)
WBC: 9.9 10*3/uL (ref 4.0–10.5)

## 2015-04-26 NOTE — Progress Notes (Signed)
Orthopedic Tech Progress Note Patient Details:  Tyler Gould 1932/03/26 [id number] Patient already in CPM at 1500 round check. Assumedly placed in by PT.  CPM Right Knee CPM Right Knee: On Right Knee Flexion (Degrees): 40 Right Knee Extension (Degrees): 0 Additional Comments: trapeze bar patient helper (trap)   [place] R [place] Silos 04/26/2015, 3:17 PM

## 2015-04-26 NOTE — Progress Notes (Signed)
Orthopedic Tech Progress Note Patient Details:  [name] 09-24-1932 [id number] On cpm at 7:00 pm Patient ID: Tyler Gould, male   DOB: Feb 01, 1932, 81 y.o.   MRN: [id number]   Braulio Bosch 04/26/2015, 7:08 PM

## 2015-04-26 NOTE — Progress Notes (Signed)
Physical Therapy Treatment Patient Details Name: Tyler Gould MRN: [id number] DOB: [birth year] Today's Date: 04/26/2015    History of Present Illness 81 y.o. male s/p Rt total knee arthroplasty.    PT Comments    Progressing ambulatory distance and mechanics. Required multiple standing rest breaks to complete distance due to pain and one episode of significant dizziness which only lasted for approx 20 seconds. Vital signs stable although did not check until patient seated back in bed. Tolerated exercises well. Will continue to progress patient as tolerated. Plan for stair training tomorrow. Patient will continue to benefit from skilled physical therapy services to further improve independence with functional mobility.   Follow Up Recommendations  Home health PT;Supervision for mobility/OOB     Equipment Recommendations  None recommended by PT    Recommendations for Other Services       Precautions / Restrictions Precautions Precautions: Knee Precaution Comments: reviewed knee precautions Restrictions Weight Bearing Restrictions: Yes RLE Weight Bearing: Weight bearing as tolerated    Mobility  Bed Mobility Overal bed mobility: Needs Assistance Bed Mobility: Sit to Supine       Sit to supine: Min assist   General bed mobility comments: Min assist for LE support to enter bed. VC for technique. Encouraged to use LLE to support RLE into bed.  Transfers Overall transfer level: Needs assistance Equipment used: Rolling walker (2 wheeled) Transfers: Sit to/from Stand Sit to Stand: Min guard         General transfer comment: Close guard for safety. Slow to rise from reclining chair VC for hand placement. Encouraged to increase use of RLE as able.  Ambulation/Gait Ambulation/Gait assistance: Min guard Ambulation Distance (Feet): 45 Feet Assistive device: Rolling walker (2 wheeled) Gait Pattern/deviations: Step-to pattern;Step-through pattern;Decreased step length -  left;Decreased stance time - right;Antalgic;Trunk flexed Gait velocity: slow   General Gait Details: Close guard for safety. Pt with instance of dizziness and feeling faint towards end of bout but did not want to sit, symptoms resolved after approx 20 seconds. Required 6 standing rest breaks to complete distance. No buckling noted, still quite antalgic. Cues for sequencing and walker control.   Stairs            Wheelchair Mobility    Modified Rankin (Stroke Patients Only)       Balance Overall balance assessment: Needs assistance Sitting-balance support: No upper extremity supported;Feet supported Sitting balance-Leahy Scale: Good     Standing balance support: During functional activity Standing balance-Leahy Scale: Poor                      Cognition Arousal/Alertness: Awake/alert Behavior During Therapy: WFL for tasks assessed/performed Overall Cognitive Status: Within Functional Limits for tasks assessed                      Exercises Total Joint Exercises Ankle Circles/Pumps: AROM;Both;10 reps;Seated Heel Slides: AROM;Right;10 reps;Seated Long Arc Quad: Strengthening;Right;Seated;10 reps Goniometric ROM: 86 degrees of Rt knee flexion in sitting    General Comments General comments (skin integrity, edema, etc.): placed back in CPM at end of therapy session      Pertinent Vitals/Pain Pain Assessment: 0-10 Pain Score:  ("A bit worse when I'm on it"no value given) Pain Location: Rt knee Pain Descriptors / Indicators: Aching (Pinching) Pain Intervention(s): Monitored during session;Repositioned    Home Living                      Prior  Function            PT Goals (current goals can now be found in the care plan section) Acute Rehab PT Goals Patient Stated Goal: Back to work PT Goal Formulation: With patient Time For Goal Achievement: 05/03/15 Potential to Achieve Goals: Good Progress towards PT goals: Progressing toward  goals    Frequency  7X/week    PT Plan Current plan remains appropriate    Co-evaluation             End of Session Equipment Utilized During Treatment: Gait belt Activity Tolerance: Patient limited by pain Patient left: with call bell/phone within reach;in bed;in CPM     Time: 1552-0802 PT Time Calculation (min) (ACUTE ONLY): 16 min  Charges:  $Gait Training: 8-22 mins                    G Codes:      Ellouise Newer May 08, 2015, 3:51 PM Camille Bal [place], [place]

## 2015-04-26 NOTE — Care Management Note (Signed)
Case Management Note  Patient Details  Name: Tyler Gould MRN: [id number] Date of Birth: 10/25/1932  Subjective/Objective:            S/p right total knee arthroplasty        Action/Plan: Instructed by MD office that patient should be set up for HHPT with [facility]. Spoke with patient, he would like to have HHPT with [facility]. [place], spoke with Vaughan Basta, they will be able to provide HHPT but Monday 04/30/15 will be start of service date. Faxed demographic sheet, HHPT order, face to face, H and P, and op note to [phone] and received confirmation. Will continue to follow.   Expected Discharge Date:                  Expected Discharge Plan:  [place]  In-House Referral:  NA  Discharge planning Services  CM Consult  Post Acute Care Choice:  Home Health Choice offered to:  Patient  DME Arranged:    DME Agency:     HH Arranged:  PT [place]:  [facility]  Status of Service:  In process, will continue to follow  Medicare Important Message Given:    Date Medicare IM Given:    Medicare IM give by:    Date Additional Medicare IM Given:    Additional Medicare Important Message give by:     If discussed at [place] of Stay Meetings, dates discussed:    Additional Comments:  Nila Nephew, RN 04/26/2015, 1:19 PM

## 2015-04-26 NOTE — Evaluation (Signed)
Occupational Therapy Evaluation Patient Details Name: Tyler Gould MRN: [id number] DOB: 08-Nov-1931 Today's Date: 04/26/2015    History of Present Illness 81 y.o. male s/p Rt total knee arthroplasty.   Clinical Impression   Pt with decline in function and safety with ADLs and ADL mobility with decreased balance, endurance and strength. Pt would benefit from acute OT services to address impairments to increase level of function and safety    Follow Up Recommendations  Home health OT    Equipment Recommendations  3 in 1 bedside comode;Tub/shower bench    Recommendations for Other Services       Precautions / Restrictions Precautions Precautions: Knee Precaution Comments: reviewed Restrictions Weight Bearing Restrictions: Yes RLE Weight Bearing: Weight bearing as tolerated      Mobility Bed Mobility               General bed mobility comments: up in recliner  Transfers Overall transfer level: Needs assistance Equipment used: Rolling walker (2 wheeled) Transfers: Sit to/from Stand Sit to Stand: Min assist         General transfer comment: min assist for boost to stand from lowest bed setting. VC for hand placement. Slow to rise.    Balance Overall balance assessment: Needs assistance Sitting-balance support: No upper extremity supported;Feet supported Sitting balance-Leahy Scale: Good     Standing balance support: During functional activity Standing balance-Leahy Scale: Poor                              ADL Overall ADL's : Needs assistance/impaired     Grooming: Wash/dry hands;Wash/dry face;Standing;Min guard   Upper Body Bathing: Supervision/ safety;Set up;Sitting   Lower Body Bathing: Moderate assistance   Upper Body Dressing : Supervision/safety;Set up;Sitting       Toilet Transfer: Moderate assistance;Maximal assistance;RW;Cueing for safety   Toileting- Clothing Manipulation and Hygiene: Minimal assistance   Tub/ Shower  Transfer: Minimal assistance;3 in 1;Grab bars;Cueing for safety   Functional mobility during ADLs: Minimal assistance;Cueing for safety       Vision  wears glasses, no change from baseline   Perception Perception Perception Tested?: No   Praxis Praxis Praxis tested?: Not tested    Pertinent Vitals/Pain Pain Assessment: 0-10 Pain Score: 5  Pain Location: R knee Pain Descriptors / Indicators: Aching Pain Intervention(s): Monitored during session;Repositioned     Hand Dominance Right   Extremity/Trunk Assessment Upper Extremity Assessment Upper Extremity Assessment: Overall WFL for tasks assessed   Lower Extremity Assessment Lower Extremity Assessment: Defer to PT evaluation   Cervical / Trunk Assessment Cervical / Trunk Assessment: Normal   Communication Communication Communication: No difficulties   Cognition Arousal/Alertness: Awake/alert Behavior During Therapy: WFL for tasks assessed/performed Overall Cognitive Status: Within Functional Limits for tasks assessed                     General Comments   pt pleasant and cooperative                 Home Living Family/patient expects to be discharged to:: Private residence Living Arrangements: Spouse/significant other Available Help at Discharge: Family;Available 24 hours/day Type of Home: House Home Access: Stairs to enter [employer] of Steps: 1 Entrance Stairs-Rails: None Home Layout: Two level;Able to live on main level with bedroom/bathroom Alternate Level Stairs-Number of Steps: 4 Alternate Level Stairs-Rails: Right Bathroom Shower/Tub: Tub/shower unit   Bathroom Toilet: Handicapped height     Home Equipment: Environmental consultant -  2 wheels          Prior Functioning/Environment Level of Independence: Independent with assistive device(s)        Comments: cane to ambulate    OT Diagnosis: Generalized weakness;Acute pain   OT Problem List: Decreased knowledge of use of DME or  AE;Impaired balance (sitting and/or standing);Decreased activity tolerance;Pain   OT Treatment/Interventions:      OT Goals(Current goals can be found in the care plan section) Acute Rehab OT Goals Patient Stated Goal: Back to work OT Goal Formulation: With patient Time For Goal Achievement: 05/03/15 Potential to Achieve Goals: Good ADL Goals Pt Will Perform Grooming: with set-up;with supervision;sitting Pt Will Perform Lower Body Bathing: with min assist;with caregiver independent in assisting;sit to/from stand Pt Will Perform Lower Body Dressing: with mod assist;with min assist;with caregiver independent in assisting;sit to/from stand Pt Will Transfer to Toilet: with min guard assist;with supervision;ambulating;grab bars (3 in 1 over toilet) Pt Will Perform Toileting - Clothing Manipulation and hygiene: with min guard assist;sit to/from stand Pt Will Perform Tub/Shower Transfer: with min guard assist;tub bench;grab bars  OT Frequency:     Barriers to D/C:  none                        End of Session Equipment Utilized During Treatment: Gait belt;Other (comment);Rolling walker (3 in 1) CPM Right Knee CPM Right Knee: Off  Activity Tolerance: Patient tolerated treatment well Patient left: in chair;with call bell/phone within reach;with family/visitor present   Time: 1039-1110 OT Time Calculation (min): 31 min Charges:  OT General Charges $OT Visit: 1 Procedure OT Evaluation $Initial OT Evaluation Tier I: 1 Procedure OT Treatments $Self Care/Home Management : 8-22 mins $Therapeutic Activity: 8-22 mins G-Codes:    Britt Bottom 04/26/2015, 1:59 PM

## 2015-04-26 NOTE — Progress Notes (Signed)
Physical Therapy Evaluation Patient Details Name: Tyler Gould MRN: [id number] DOB: 29-Jan-1932 Today's Date: 04/26/2015   History of Present Illness  81 y.o. male s/p Rt total knee arthroplasty.  Clinical Impression  Pt is s/p TKA resulting in the deficits listed below (see PT Problem List). Ambulates slowly up to 25 feet this AM. Reviewed precautions and therapeutic exercises. Pt will benefit from skilled PT to increase their independence and safety with mobility to allow discharge to the venue listed below.      Follow Up Recommendations Home health PT;Supervision for mobility/OOB    Equipment Recommendations  None recommended by PT    Recommendations for Other Services       Precautions / Restrictions Precautions Precautions: Knee Precaution Booklet Issued: Yes (comment) Precaution Comments: reviewed Restrictions Weight Bearing Restrictions: Yes RLE Weight Bearing: Weight bearing as tolerated      Mobility  Bed Mobility Overal bed mobility: Modified Independent;Needs Assistance Bed Mobility: Supine to Sit     Supine to sit: Supervision;HOB elevated     General bed mobility comments: VC for technique with use of rail. HOB elevated  Transfers Overall transfer level: Needs assistance Equipment used: Rolling walker (2 wheeled) Transfers: Sit to/from Stand Sit to Stand: Min assist         General transfer comment: min assist for boost to stand from lowest bed setting. VC for hand placement. Slow to rise.  Ambulation/Gait Ambulation/Gait assistance: Min guard Ambulation Distance (Feet): 25 Feet Assistive device: Rolling walker (2 wheeled) Gait Pattern/deviations: Step-to pattern;Decreased step length - left;Decreased stance time - right;Antalgic;Trunk flexed Gait velocity: slow   General Gait Details: Close guard for safety. Moderately antalgic. Educated on safe DME use with a rolling walker. VC for sequencing.  Stairs            Wheelchair  Mobility    Modified Rankin (Stroke Patients Only)       Balance Overall balance assessment: Needs assistance Sitting-balance support: No upper extremity supported;Feet supported Sitting balance-Leahy Scale: Good     Standing balance support: Bilateral upper extremity supported;Single extremity supported Standing balance-Leahy Scale: Poor                               Pertinent Vitals/Pain Pain Assessment: 0-10 Pain Score: 4  Pain Location: Rt knee Pain Descriptors / Indicators: Aching Pain Intervention(s): Monitored during session;Repositioned    Home Living Family/patient expects to be discharged to:: Private residence Living Arrangements: Spouse/significant other Available Help at Discharge: Family;Available 24 hours/day Type of Home: House Home Access: Stairs to enter Entrance Stairs-Rails: None Entrance Stairs-Number of Steps: 1 Home Layout: Two level;Able to live on main level with bedroom/bathroom Home Equipment: Walker - 2 wheels (3-in-1 commode)      Prior Function Level of Independence: Independent with assistive device(s)         Comments: cane to ambulate     Hand Dominance   Dominant Hand: Right    Extremity/Trunk Assessment   Upper Extremity Assessment: Defer to OT evaluation           Lower Extremity Assessment: RLE deficits/detail RLE Deficits / Details: decreased strength and ROM as expected postop       Communication   Communication: No difficulties  Cognition Arousal/Alertness: Awake/alert Behavior During Therapy: WFL for tasks assessed/performed Overall Cognitive Status: Within Functional Limits for tasks assessed  General Comments General comments (skin integrity, edema, etc.): Reviewed precautions, exercises, and positioning for optimal healing.    Exercises Total Joint Exercises Ankle Circles/Pumps: AROM;Both;10 reps;Seated Quad Sets: AROM;10 reps;Seated;Both Long Arc Quad:  Strengthening;Right;5 reps;Seated      Assessment/Plan    PT Assessment Patient needs continued PT services  PT Diagnosis Difficulty walking;Abnormality of gait;Acute pain   PT Problem List Decreased strength;Decreased range of motion;Decreased activity tolerance;Decreased balance;Decreased mobility;Decreased knowledge of use of DME;Decreased knowledge of precautions;Pain  PT Treatment Interventions DME instruction;Gait training;Stair training;Functional mobility training;Therapeutic activities;Therapeutic exercise;Balance training;Neuromuscular re-education;Patient/family education;Modalities   PT Goals (Current goals can be found in the Care Plan section) Acute Rehab PT Goals Patient Stated Goal: Back to work PT Goal Formulation: With patient Time For Goal Achievement: 05/03/15 Potential to Achieve Goals: Good    Frequency 7X/week   Barriers to discharge        Co-evaluation               End of Session Equipment Utilized During Treatment: Gait belt Activity Tolerance: Patient tolerated treatment well Patient left: in chair;with call bell/phone within reach Nurse Communication: Mobility status         Time: 0822-0850 PT Time Calculation (min) (ACUTE ONLY): 28 min   Charges:   PT Evaluation $Initial PT Evaluation Tier I: 1 Procedure PT Treatments $Gait Training: 8-22 mins   PT G CodesEllouise Newer 04/26/2015, 9:26 AM Elayne Snare, [place]

## 2015-04-26 NOTE — Progress Notes (Signed)
Patient ID: Tyler Gould, male   DOB: December 31, 1931, 81 y.o.   MRN: [id number] PATIENT ID: Tyler Gould  MRN: [id number]  DOB/AGE:  12/14/1931 / 81 y.o.  1 Day Post-Op Procedure(s) (LRB): TOTAL KNEE ARTHROPLASTY (Right)    PROGRESS NOTE Subjective: Patient is alert, oriented, x1 Nausea, no Vomiting, yes passing gas, no Bowel Movement. Taking PO well. Denies SOB, Chest or Calf Pain. Using Incentive Spirometer, PAS in place. Ambulate WBAT, CPM 0-40 Patient reports pain as 4 on 0-10 scale  .    Objective: Vital signs in last 24 hours: Filed Vitals:   04/25/15 2100 04/25/15 2344 04/26/15 0214 04/26/15 0600  BP: 123/81 130/69 126/71 107/69  Pulse: 75 74 60 63  Temp: 97.8 F (36.6 C) 98.1 F (36.7 C) 98.6 F (37 C) 98.9 F (37.2 C)  TempSrc: Oral     Resp: 18 18 18 18   Height:      Weight:      SpO2: 99% 100% 95% 97%      Intake/Output from previous day: I/O last 3 completed shifts: In: 1739.6 [P.O.:400; I.V.:1339.6] Out: 500 [Urine:300; Blood:200]   Intake/Output this shift:     LABORATORY DATA:  Recent Labs  04/26/15 0343  WBC 9.9  HGB 12.5*  HCT 37.5*  PLT 185  NA 138  K 4.6  CL 104  CO2 30  BUN 15  CREATININE 1.12  GLUCOSE 182*  CALCIUM 8.8*    Examination: Neurologically intact ABD soft Neurovascular intact Sensation intact distally Intact pulses distally Dorsiflexion/Plantar flexion intact Incision: dressing C/D/I No cellulitis present Compartment soft}  Assessment:   1 Day Post-Op Procedure(s) (LRB): TOTAL KNEE ARTHROPLASTY (Right) ADDITIONAL DIAGNOSIS: Expected Acute Blood Loss Anemia, Cardiac Arrythmia aflutter  Plan: PT/OT WBAT, CPM 5/hrs day until ROM 0-90 degrees, then D/C CPM DVT Prophylaxis:  SCDx72hrs, Xarelto 10mg  QD for 1 week then back to 20 mg QD DISCHARGE PLAN: Home DISCHARGE NEEDS: HHPT, CPM, Walker and 3-in-1 comode seat     Kota Ciancio J 04/26/2015, 7:27 AM

## 2015-04-27 LAB — CBC
HCT: 33.2 % — ABNORMAL LOW (ref 39.0–52.0)
Hemoglobin: 11.3 g/dL — ABNORMAL LOW (ref 13.0–17.0)
MCH: 33.3 pg (ref 26.0–34.0)
MCHC: 34 g/dL (ref 30.0–36.0)
MCV: 97.9 fL (ref 78.0–100.0)
PLATELETS: 174 10*3/uL (ref 150–400)
RBC: 3.39 MIL/uL — ABNORMAL LOW (ref 4.22–5.81)
RDW: 12.9 % (ref 11.5–15.5)
WBC: 11.1 10*3/uL — ABNORMAL HIGH (ref 4.0–10.5)

## 2015-04-27 NOTE — Progress Notes (Signed)
Patient received order to discharge, removing IV, printed necessary discharge instructions, educated patient, and walked to front door in wheelchair

## 2015-04-27 NOTE — Progress Notes (Signed)
Physical Therapy Treatment Patient Details Name: Tyler Gould MRN: [id number] DOB: 1932-08-14 Today's Date: 04/27/2015    History of Present Illness 81 y.o. male s/p Rt total knee arthroplasty.    PT Comments    Progressing well today. Tolerating 95 feet of ambulation at a supervision level. No loss of balance during ambulatory bout. Attempted stairs however pt with decreased confidence with this task requiring min assist to safely complete. From a PT standpoint, feel he will be adequate for d/c following one more therapy session.  Follow Up Recommendations  Home health PT;Supervision for mobility/OOB     Equipment Recommendations  None recommended by PT    Recommendations for Other Services       Precautions / Restrictions Precautions Precautions: Knee Precaution Comments: reviewed knee precautions Restrictions Weight Bearing Restrictions: Yes RLE Weight Bearing: Weight bearing as tolerated    Mobility  Bed Mobility Overal bed mobility: Needs Assistance Bed Mobility: Sit to Supine     Supine to sit: Supervision Sit to supine: Supervision   General bed mobility comments: Supervision for safety. VC to use LLE to support RLE into bed. Able to perform without physical assist  Transfers Overall transfer level: Needs assistance Equipment used: Rolling walker (2 wheeled) Transfers: Sit to/from Stand Sit to Stand: Supervision Stand pivot transfers: Min guard       General transfer comment: supervision for safety. Improved balance and technique today with this task. Good control with desent onto recliner and bed.  Ambulation/Gait Ambulation/Gait assistance: Supervision Ambulation Distance (Feet): 95 Feet Assistive device: Rolling walker (2 wheeled) Gait Pattern/deviations: Step-through pattern;Decreased step length - left;Decreased stance time - right;Antalgic Gait velocity: decreased   General Gait Details: Supervision for safety. Cues occasionally for patient  to slow down for better RW control. 2 standing rest breaks to complete distance. No buckling noted. Progressing to continuous push of RW while stepping with step-through gait pattern as instructed.   Stairs Stairs: Yes Stairs assistance: Min assist Stair Management: One rail Right;Step to pattern;Sideways;Backwards;With walker Number of Stairs: 3 General stair comments: Attempted sideways approach however not confident enough to bear weight through RLE to step while holding single rail with both hands. Was able to ascend steps backwards with min assist to block RW. Educated on this technique however encouraged to practice with HHPT prior to climbing stairs to enter office at home. Verbalizes understanding.  Wheelchair Mobility    Modified Rankin (Stroke Patients Only)       Balance                                    Cognition Arousal/Alertness: Awake/alert Behavior During Therapy: WFL for tasks assessed/performed Overall Cognitive Status: Within Functional Limits for tasks assessed                      Exercises Total Joint Exercises Ankle Circles/Pumps: AROM;Both;10 reps;Supine Quad Sets: AROM;Both;Seated;15 reps Heel Slides: AROM;Right;10 reps;Seated Long Arc Quad: Strengthening;Right;Seated;10 reps Goniometric ROM: 19-90 degrees Rt knee flexion    General Comments General comments (skin integrity, edema, etc.): In CPM at end of therapy session      Pertinent Vitals/Pain Pain Assessment: 0-10 Pain Score:  ("Doing better today" no value given) Pain Location: Rt knee Pain Descriptors / Indicators: Aching Pain Intervention(s): Monitored during session;Repositioned    Home Living  Prior Function            PT Goals (current goals can now be found in the care plan section) Acute Rehab PT Goals Patient Stated Goal: Back to work PT Goal Formulation: With patient Time For Goal Achievement: 05/03/15 Potential to  Achieve Goals: Good Progress towards PT goals: Progressing toward goals    Frequency  7X/week    PT Plan Current plan remains appropriate    Co-evaluation             End of Session Equipment Utilized During Treatment: Gait belt Activity Tolerance: Patient tolerated treatment well Patient left: with call bell/phone within reach;in bed;in CPM     Time: 0832-0907 PT Time Calculation (min) (ACUTE ONLY): 35 min  Charges:  $Gait Training: 8-22 mins $Therapeutic Exercise: 8-22 mins                    G Codes:      Ellouise Newer 2015-05-23, 9:55 AM Elayne Snare, [place]

## 2015-04-27 NOTE — Progress Notes (Signed)
PATIENT ID: Tyler Gould  MRN: [id number]  DOB/AGE:  Apr 09, 1932 / 81 y.o.  2 Days Post-Op Procedure(s) (LRB): TOTAL KNEE ARTHROPLASTY (Right)    PROGRESS NOTE Subjective: Patient is alert, oriented, no Nausea, no Vomiting, yes passing gas, no Bowel Movement. Taking PO well. Denies SOB, Chest or Calf Pain. Using Incentive Spirometer, PAS in place. Ambulate WBAT, CPM 0-60 Patient reports pain as 2 on 0-10 scale and 3 on 0-10 scale  .    Objective: Vital signs in last 24 hours: Filed Vitals:   04/26/15 1010 04/26/15 1543 04/26/15 2016 04/27/15 0545  BP: 130/62 105/62 121/62 103/64  Pulse: 89 74 91 108  Temp:  100 F (37.8 C) 99.1 F (37.3 C) 99.1 F (37.3 C)  TempSrc:  Oral    Resp:  16 16 16   Height:      Weight:      SpO2:  97% 97% 97%      Intake/Output from previous day: I/O last 3 completed shifts: In: 1729.6 [P.O.:640; I.V.:1089.6] Out: 1200 [Urine:1200]   Intake/Output this shift:     LABORATORY DATA:  Recent Labs  04/26/15 0343 04/27/15 0500  WBC 9.9 11.1*  HGB 12.5* 11.3*  HCT 37.5* 33.2*  PLT 185 174  NA 138  --   K 4.6  --   CL 104  --   CO2 30  --   BUN 15  --   CREATININE 1.12  --   GLUCOSE 182*  --   CALCIUM 8.8*  --     Examination: Neurologically intact Neurovascular intact Sensation intact distally Intact pulses distally Dorsiflexion/Plantar flexion intact Incision: dressing C/D/I No cellulitis present Compartment soft}  Assessment:   2 Days Post-Op Procedure(s) (LRB): TOTAL KNEE ARTHROPLASTY (Right) ADDITIONAL DIAGNOSIS: Expected Acute Blood Loss Anemia,  aflutter  Plan: PT/OT WBAT, CPM 5/hrs day until ROM 0-90 degrees, then D/C CPM DVT Prophylaxis:  SCDx72hrs, ASA 325 mg BID x 2 weeks DISCHARGE PLAN: Home DISCHARGE NEEDS: HHPT, HHRN, CPM, Walker and 3-in-1 comode seat     Tyler Gould R 04/27/2015, 9:43 AM

## 2015-04-27 NOTE — Progress Notes (Signed)
Spoke with Ruby Cola with [facility], they have delivered CPM, 3N1 and rolling walker to patient's home.

## 2015-04-27 NOTE — Progress Notes (Signed)
Physical Therapy Treatment Patient Details Name: Tyler Gould MRN: [id number] DOB: May 09, 1932 Today's Date: 04/27/2015    History of Present Illness 81 y.o. male s/p Rt total knee arthroplasty.    PT Comments    Good progress. Ambulating 95 with supervision while using a rolling walker for support. Wife actively participated in stair training which was successfully completed this afternoon. Pt and wife have no further questions/concerns regarding mobility. Patient is eager to return home and confident with his abilities. Adequate for discharge from a PT standpoint.  Follow Up Recommendations  Home health PT;Supervision for mobility/OOB     Equipment Recommendations  None recommended by PT    Recommendations for Other Services       Precautions / Restrictions Precautions Precautions: Knee Precaution Comments: reviewed knee precautions Restrictions Weight Bearing Restrictions: Yes RLE Weight Bearing: Weight bearing as tolerated    Mobility  Bed Mobility               General bed mobility comments: sitting in recliner  Transfers Overall transfer level: Needs assistance Equipment used: Rolling walker (2 wheeled) Transfers: Sit to/from Stand Sit to Stand: Supervision         General transfer comment: VC for hand placement. No physical assist needed from reclining chair  Ambulation/Gait Ambulation/Gait assistance: Supervision Ambulation Distance (Feet): 95 Feet Assistive device: Rolling walker (2 wheeled) Gait Pattern/deviations: Decreased step length - right;Decreased step length - left;Decreased stance time - right;Antalgic Gait velocity: decreased   General Gait Details: Intermittent cues for forward gaze. Still antalgic. Encouraged to increase WB through LE versus UEs as able. Supervision for safety. No buckling noted. Slowly improving symmetry of gait with cues.   Stairs Stairs: Yes Stairs assistance: Min assist Stair Management: No rails;Step to  pattern;Backwards;With walker Number of Stairs: 1 General stair comments: Wife present and actively participated in single step navigation training with a rolling walker using backwards approach. Pt able to perform safely. Cues for technique throughout  Wheelchair Mobility    Modified Rankin (Stroke Patients Only)       Balance                                    Cognition Arousal/Alertness: Awake/alert Behavior During Therapy: WFL for tasks assessed/performed Overall Cognitive Status: Within Functional Limits for tasks assessed                      Exercises      General Comments        Pertinent Vitals/Pain Pain Assessment: 0-10 Pain Score:  ("Hurts a little bit" no value given) Pain Location: Rt knee Pain Descriptors / Indicators: Aching Pain Intervention(s): Monitored during session;Repositioned (Declines RN be notified for pain medication)    Home Living                      Prior Function            PT Goals (current goals can now be found in the care plan section) Acute Rehab PT Goals Patient Stated Goal: Back to work PT Goal Formulation: With patient Time For Goal Achievement: 05/03/15 Potential to Achieve Goals: Good Progress towards PT goals: Progressing toward goals    Frequency  7X/week    PT Plan Current plan remains appropriate    Co-evaluation             End of Session Equipment  Utilized During Treatment: Gait belt Activity Tolerance: Patient tolerated treatment well Patient left: with call bell/phone within reach;in chair;with family/visitor present     Time: 7373-6681 PT Time Calculation (min) (ACUTE ONLY): 24 min  Charges:  $Gait Training: 23-37 mins                    G Codes:      Ellouise Newer 05/15/15, 4:19 PM Camille Bal [place], [place]

## 2015-04-27 NOTE — Progress Notes (Signed)
Occupational Therapy Treatment Patient Details Name: Tyler Gould MRN: [id number] DOB: January 08, 1932 Today's Date: 04/27/2015    History of present illness 81 y.o. male s/p Rt total knee arthroplasty.   OT comments  Pt. Progressing well with acute OT goals.  Noted improvement with bed mobility and sit/stand for functional transfers in room and b.room.  Reports wife available to assist as needed. Very interested in tub bench for use at home.  Will address this transfer next session.  Follow Up Recommendations  Home health OT    Equipment Recommendations  3 in 1 bedside comode;Tub/shower bench    Recommendations for Other Services      Precautions / Restrictions Precautions Precautions: Knee Precaution Comments: reviewed knee precautions Restrictions RLE Weight Bearing: Weight bearing as tolerated       Mobility Bed Mobility Overal bed mobility: Needs Assistance Bed Mobility: Supine to Sit     Supine to sit: Supervision     General bed mobility comments: HOB slightly elevated as pt. states he has wedges at home to create incline.  no rails, exit from right side to simulate home env.  min a to guide RLE oob, pt. states wife available for this level of A at home  Transfers Overall transfer level: Needs assistance Equipment used: Rolling walker (2 wheeled) Transfers: Sit to/from [facility] Sit to Stand: Min guard Stand pivot transfers: Min guard       General transfer comment: Close guard for safety. Slow to rise from reclining chair VC for hand placement. Encouraged to increase use of RLE as able.    Balance                                   ADL Overall ADL's : Needs assistance/impaired     Grooming: Wash/dry hands;Supervision/safety;Standing         Lower Body Bathing Details (indicate cue type and reason): reports wife available to assist       Lower Body Dressing Details (indicate cue type and reason): reports wife  avialable to assist Toilet Transfer: Supervision/safety;Ambulation;Comfort height toilet;BSC;RW Toilet Transfer Details (indicate cue type and reason): bsc over the toilet Toileting- Clothing Manipulation and Hygiene: Min guard;Sit to/from Nurse, children's Details (indicate cue type and reason): reviewed, he has a tub with R faucet, states he will likely sponge bathe initially but also interested in a tub bench, say she has looked at them and feels it is a good choice Functional mobility during ADLs: Supervision/safety;Min guard;Cueing for safety General ADL Comments: progressing well, will have wife to assist wtih LB needs, plans on getting a tub bench for bathing      Vision                     Perception     Praxis      Cognition   Behavior During Therapy: [facility] for tasks assessed/performed Overall Cognitive Status: Within Functional Limits for tasks assessed                       Extremity/Trunk Assessment               Exercises     Shoulder Instructions       General Comments      Pertinent Vitals/ Pain       Pain Assessment: No/denies pain  Home Living  Prior Functioning/Environment              Frequency       Progress Toward Goals  OT Goals(current goals can now be found in the care plan section)  Progress towards OT goals: Progressing toward goals     Plan Discharge plan remains appropriate    Co-evaluation                 End of Session Equipment Utilized During Treatment: Gait belt;Rolling walker   Activity Tolerance Patient tolerated treatment well   Patient Left in chair;with call bell/phone within reach   Nurse Communication          Time: 1410-3013 OT Time Calculation (min): 22 min  Charges: OT General Charges $OT Visit: 1 Procedure OT Treatments $Self Care/Home Management : 8-22 mins  Janice Coffin,  COTA/L 04/27/2015, 8:16 AM

## 2015-05-08 NOTE — Discharge Summary (Signed)
Patient ID: Tyler Gould MRN: [id number] DOB/AGE: 04-11-1932 81 y.o.  Admit date: 04/25/2015 Discharge date: 04/27/15 Admission Diagnoses:  Active Problems:   Arthritis of knee   Discharge Diagnoses:  Same  Past Medical History  Diagnosis Date  . Dysrhythmia     Aflutter  . Fibromyalgia   . Cancer     Prostate, Skin cancer  . History of blood product transfusion 2006ish    6 units  GI - from NSAIDs  . GI (gastrointestinal bleed) 2006 [facility]    NSAIDS  . Diverticulitis     Surgeries: Procedure(s): TOTAL KNEE ARTHROPLASTY on 04/25/2015   Consultants:    Discharged Condition: Improved  Hospital Course: Tien Spooner is an 81 y.o. male who was admitted 04/25/2015 for operative treatment of<principal problem not specified>. Patient has severe unremitting pain that affects sleep, daily activities, and work/hobbies. After pre-op clearance the patient was taken to the operating room on 04/25/2015 and underwent  Procedure(s): TOTAL KNEE ARTHROPLASTY.    Patient was given perioperative antibiotics:  Anti-infectives    Start     Dose/Rate Route Frequency Ordered Stop   04/25/15 0600  ceFAZolin (ANCEF) IVPB 2 g/50 mL premix  Status:  Discontinued     2 g 100 mL/hr over 30 Minutes Intravenous On call to O.R. 04/24/15 1420 04/25/15 1722       Patient was given sequential compression devices, early ambulation, and chemoprophylaxis to prevent DVT.  Patient benefited maximally from hospital stay and there were no complications.    Recent vital signs: No data found.    Recent laboratory studies: No results for input(s): WBC, HGB, HCT, PLT, NA, K, CL, CO2, BUN, CREATININE, GLUCOSE, INR, CALCIUM in the last 72 hours.  Invalid input(s): PT, 2   Discharge Medications:     Medication List    TAKE these medications        diltiazem 120 MG 24 hr capsule  Commonly known as:  TIAZAC  Take 120 mg by mouth daily.     fosinopril 10 MG tablet  Commonly known as:  MONOPRIL  Take 10  mg by mouth daily.     hydrochlorothiazide 25 MG tablet  Commonly known as:  HYDRODIURIL  Take 25 mg by mouth daily.     HYDROmorphone 2 MG tablet  Commonly known as:  DILAUDID  Take 1 tablet (2 mg total) by mouth every 6 (six) hours as needed for severe pain.     MULTIVITAMIN ADULTS 50+ Tabs  Take 1 tablet by mouth daily.     Omega 3 1200 MG Caps  Take 2,400 mg by mouth daily.     rivaroxaban 10 MG Tabs tablet  Commonly known as:  XARELTO  Take 1 tablet (10 mg total) by mouth daily.     rivaroxaban 20 MG Tabs tablet  Commonly known as:  XARELTO  Take 1 tablet (20 mg total) by mouth daily with supper.  Start taking on:  05/09/2015     simvastatin 10 MG tablet  Commonly known as:  ZOCOR  Take 10 mg by mouth daily at 6 PM.     tiZANidine 2 MG tablet  Commonly known as:  ZANAFLEX  Take 1 tablet (2 mg total) by mouth every 6 (six) hours as needed for muscle spasms.     vardenafil 20 MG tablet  Commonly known as:  LEVITRA  Take 20 mg by mouth daily as needed for erectile dysfunction.     vitamin C 1000 MG tablet  Take 1,000 mg  by mouth daily.     VITAMIN D PO  Take 2,000 Units by mouth daily.     vitamin E 400 UNIT capsule  Take 400 Units by mouth daily.        Diagnostic Studies: Dg Chest 2 View  04/12/2015   CLINICAL DATA:  Preop chest x-ray for total knee replacement planned for 2 weeks.  EXAM: CHEST  2 VIEW  COMPARISON:  None.  FINDINGS: Hyperinflation and bronchitic markings, likely COPD in this patient with history of previous smoking.  Normal heart size.  Aortic tortuosity which is mild.  There is no edema, consolidation, effusion, or pneumothorax.  IMPRESSION: 1. No active cardiopulmonary disease. 2. COPD changes.   Electronically Signed   By: Monte Fantasia M.D.   On: 04/12/2015 16:15    Disposition: 01-Home or Self Care        Follow-up Information    Follow up with Kerin Salen, MD In 2 weeks.   Specialty:  Orthopedic Surgery   Contact information:    [place] [phone] [phone]       Follow up with [facility].   Why:  They will contact you to schedule home therapy visits.   Contact information:   [place] [id number] [phone]        Signed: Hardin Negus, ERIC R 05/08/2015, 12:23 PM

## 2016-02-23 IMAGING — CR DG CHEST 2V
2 series · 2 of 2 positions shown · non-contrast
Comparison: None.

CLINICAL DATA: Preop chest x-ray for total knee replacement planned
for 2 weeks.

EXAM:
CHEST  2 VIEW

[w chest pa]
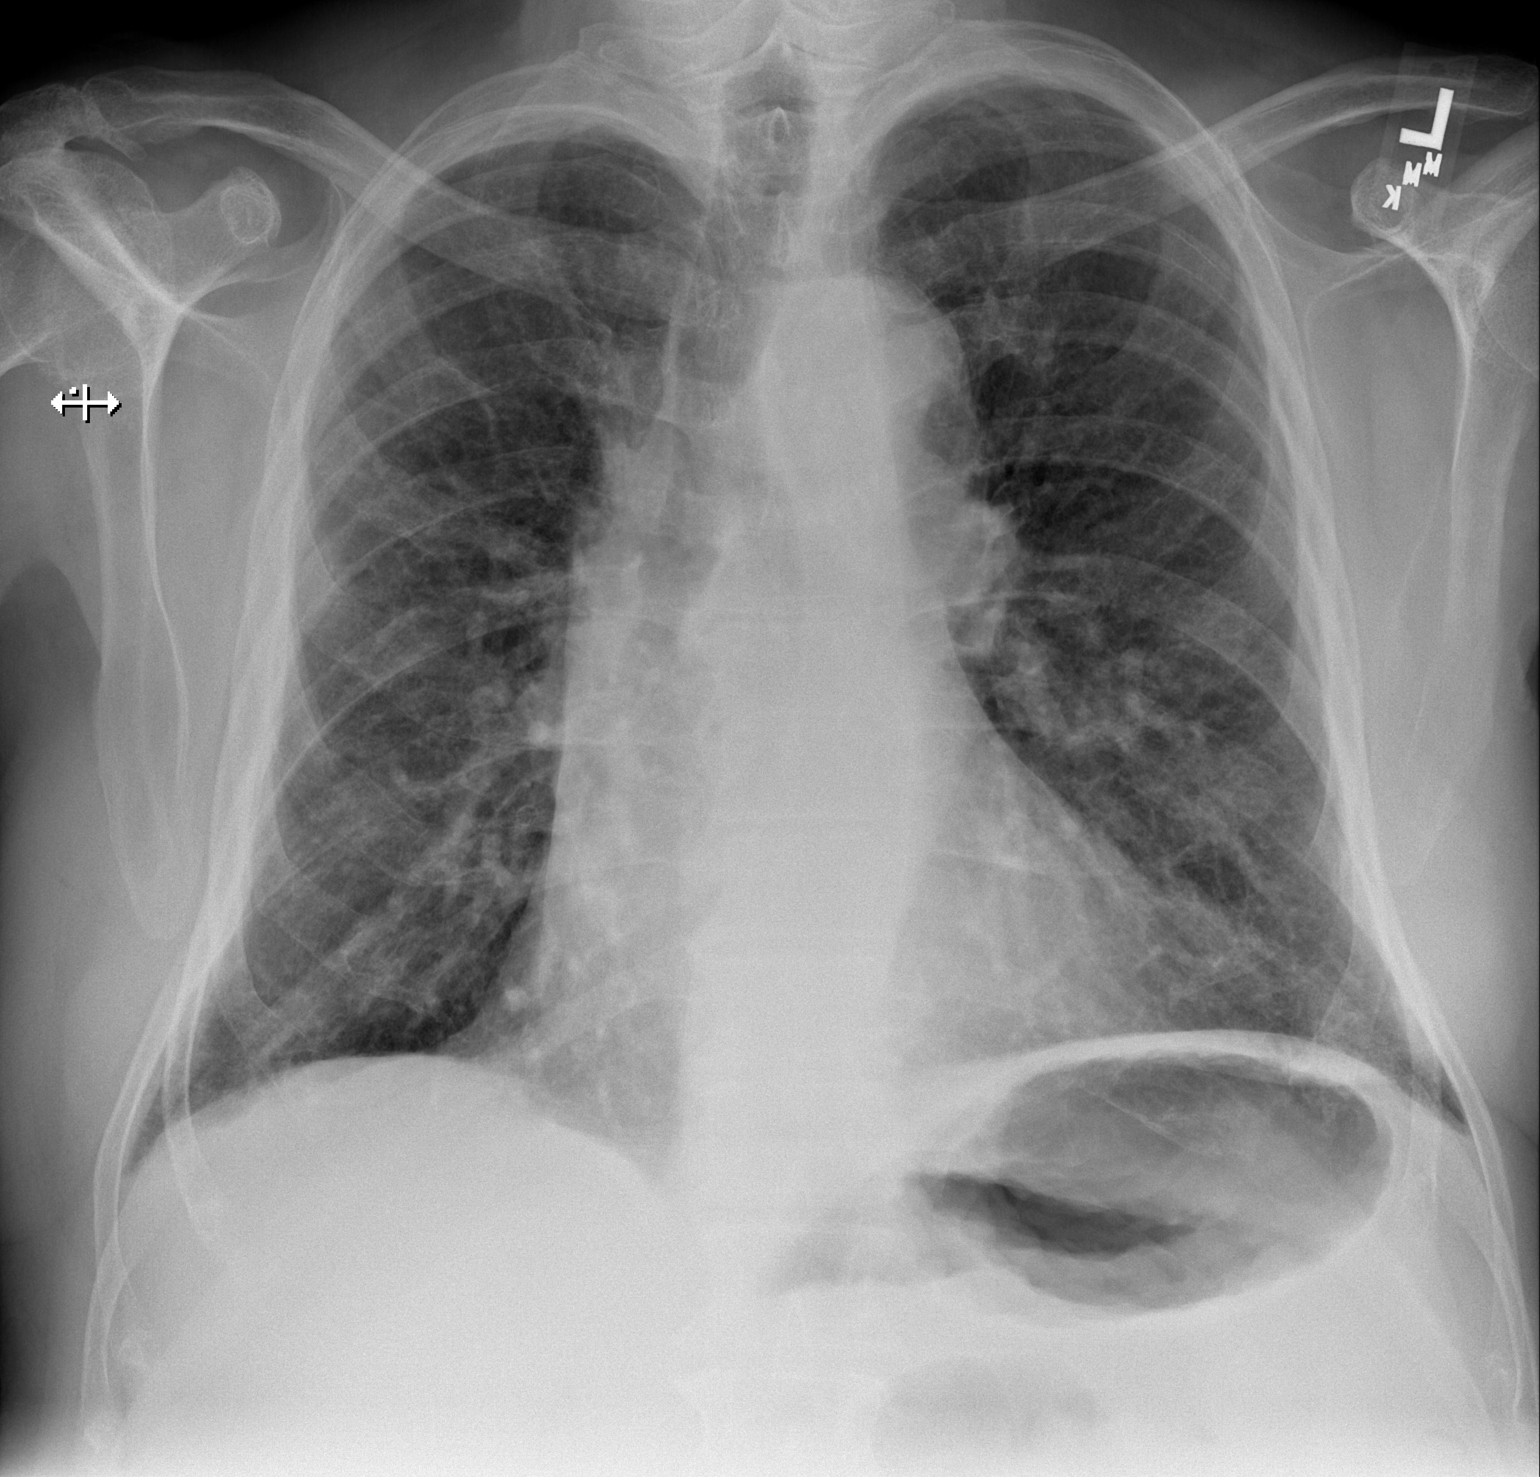

[w chest lat]
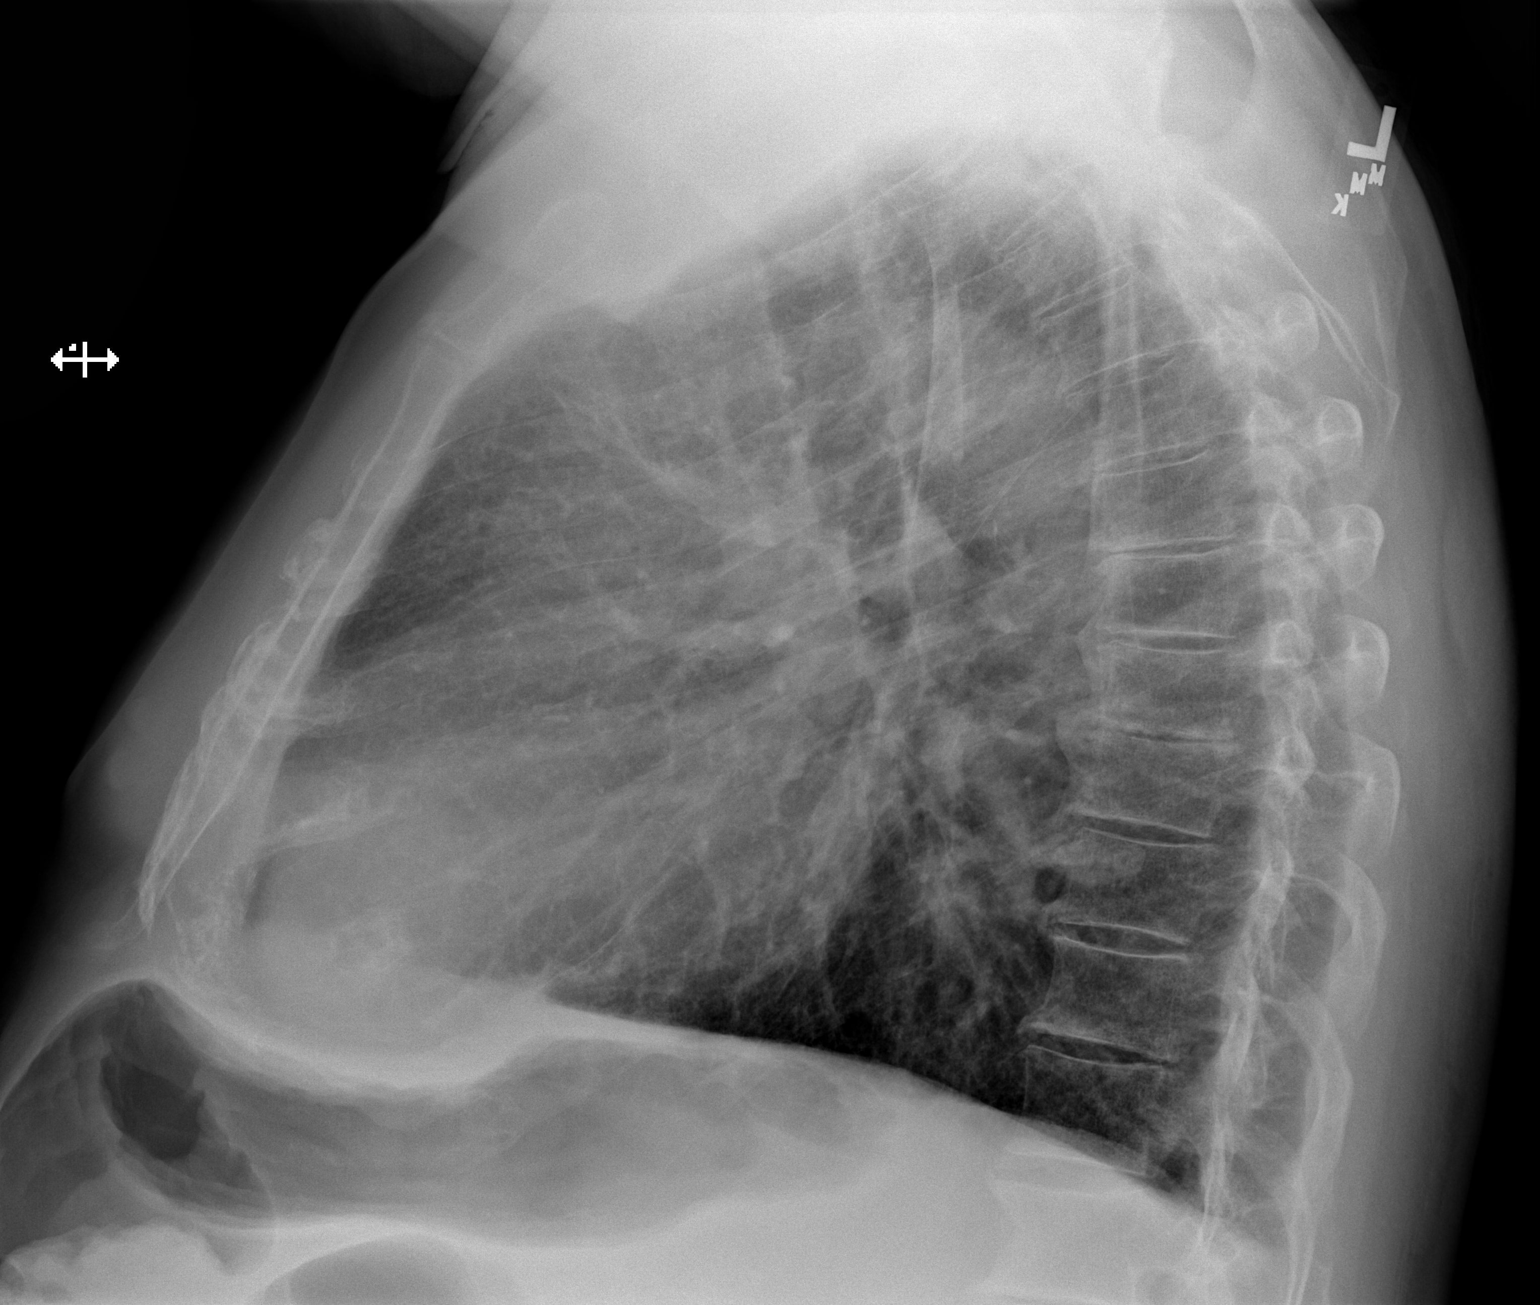

[2 of 2 positions shown; findings below may reference images not displayed]

FINDINGS: Hyperinflation and bronchitic markings, likely COPD in this patient
with history of previous smoking.

Normal heart size.  Aortic tortuosity which is mild.

There is no edema, consolidation, effusion, or pneumothorax.
IMPRESSION: 1. No active cardiopulmonary disease.
2. COPD changes.

## 2019-02-02 ENCOUNTER — Telehealth (INDEPENDENT_AMBULATORY_CARE_PROVIDER_SITE_OTHER): Payer: Self-pay | Admitting: Internal Medicine

## 2019-02-02 NOTE — Telephone Encounter (Signed)
Received referral - Patient has hemoglobin of 6.5 - hemoccult positive has symptoms of sob,cough and weakness - per office protocol Deberah Castle advised to send patient to Emergency Room - Deberah Castle will call Nurse to advise of protocol

## 2020-08-03 DEATH — deceased
